# Patient Record
Sex: Female | Born: 1935 | Hispanic: No | Marital: Married | Smoking: Never smoker
Health system: Southern US, Community
[De-identification: ages and names within clinical notes are randomized; demographics above are authoritative.]

## PROBLEM LIST (undated history)

## (undated) DIAGNOSIS — F32A Depression, unspecified: Secondary | ICD-10-CM

## (undated) DIAGNOSIS — R269 Unspecified abnormalities of gait and mobility: Secondary | ICD-10-CM

## (undated) DIAGNOSIS — F411 Generalized anxiety disorder: Secondary | ICD-10-CM

## (undated) DIAGNOSIS — K219 Gastro-esophageal reflux disease without esophagitis: Secondary | ICD-10-CM

## (undated) DIAGNOSIS — F0390 Unspecified dementia without behavioral disturbance: Secondary | ICD-10-CM

## (undated) DIAGNOSIS — G3184 Mild cognitive impairment, so stated: Secondary | ICD-10-CM

## (undated) DIAGNOSIS — E119 Type 2 diabetes mellitus without complications: Secondary | ICD-10-CM

## (undated) DIAGNOSIS — R441 Visual hallucinations: Secondary | ICD-10-CM

## (undated) DIAGNOSIS — I480 Paroxysmal atrial fibrillation: Secondary | ICD-10-CM

## (undated) DIAGNOSIS — F419 Anxiety disorder, unspecified: Secondary | ICD-10-CM

## (undated) DIAGNOSIS — F329 Major depressive disorder, single episode, unspecified: Secondary | ICD-10-CM

## (undated) HISTORY — PX: ESOPHAGEAL DILATION: SHX303

## (undated) HISTORY — PX: TONSILLECTOMY: SUR1361

## (undated) HISTORY — DX: Anxiety disorder, unspecified: F41.9

## (undated) HISTORY — DX: Paroxysmal atrial fibrillation: I48.0

## (undated) HISTORY — PX: OOPHORECTOMY: SHX86

## (undated) HISTORY — PX: ABDOMINAL SACROCOLPOPEXY: SHX1114

## (undated) HISTORY — DX: Type 2 diabetes mellitus without complications: E11.9

## (undated) HISTORY — DX: Visual hallucinations: R44.1

## (undated) HISTORY — DX: Unspecified abnormalities of gait and mobility: R26.9

## (undated) HISTORY — PX: SUPRAPUBIC CATHETER INSERTION: SUR719

## (undated) HISTORY — DX: Generalized anxiety disorder: F41.1

## (undated) HISTORY — DX: Mild cognitive impairment of uncertain or unknown etiology: G31.84

## (undated) HISTORY — PX: ABDOMINAL HYSTERECTOMY: SUR658

## (undated) HISTORY — DX: Gastro-esophageal reflux disease without esophagitis: K21.9

## (undated) HISTORY — PX: COLPORRHAPHY: SHX921

## (undated) HISTORY — PX: OTHER SURGICAL HISTORY: SHX169

## (undated) HISTORY — DX: Unspecified dementia, unspecified severity, without behavioral disturbance, psychotic disturbance, mood disturbance, and anxiety: F03.90

## (undated) HISTORY — DX: Depression, unspecified: F32.A

## (undated) HISTORY — PX: HIP SURGERY: SHX245

---
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
## 2005-02-14 ENCOUNTER — Other Ambulatory Visit: Admission: RE | Admit: 2005-02-14 | Discharge: 2005-02-14 | Payer: Self-pay | Admitting: Obstetrics and Gynecology

## 2007-01-22 ENCOUNTER — Ambulatory Visit: Payer: Self-pay | Admitting: Internal Medicine

## 2007-01-28 ENCOUNTER — Ambulatory Visit: Payer: Self-pay | Admitting: Internal Medicine

## 2007-01-28 ENCOUNTER — Ambulatory Visit: Payer: Self-pay

## 2007-01-28 ENCOUNTER — Encounter: Payer: Self-pay | Admitting: Internal Medicine

## 2007-02-27 ENCOUNTER — Ambulatory Visit: Payer: Self-pay | Admitting: Internal Medicine

## 2007-02-27 LAB — CONVERTED CEMR LAB
BUN: 15 mg/dL (ref 6–23)
CO2: 28 meq/L (ref 19–32)
Calcium: 10.4 mg/dL (ref 8.4–10.5)
Creatinine, Ser: 1.3 mg/dL — ABNORMAL HIGH (ref 0.4–1.2)
GFR calc Af Amer: 51 mL/min
Glucose, Bld: 143 mg/dL — ABNORMAL HIGH (ref 70–99)
Potassium: 4.4 meq/L (ref 3.5–5.1)

## 2010-08-14 NOTE — Assessment & Plan Note (Signed)
[facility]                            CARDIOLOGY OFFICE NOTE   EVALIN, SHAWHAN                   MRN:          [id number]  DATE:01/22/2007                            DOB:          26-Nov-1930    REFERRING PHYSICIAN:  Marianne Sofia   REASON FOR CONSULTATION:  Irregular heartbeat.   HISTORY OF PRESENT ILLNESS:  Ms. Wanda Gonzalez is a very pleasant 77 year old  woman with the history of hypertension and hyperlipidemia.  She denies  any known history of coronary artery disease.  She tells me, about two  years ago, she developed left-arm pain and was evaluated with an EKG and  was directly admitted to the hospital in [place].  She said she  underwent a whole battery of tests, including a stress test.  She said  everything turned out to be normal, though she did not have specific  results.  Back in 2000, she was also evaluated by Dr. Tenny Craw.  She was  attempting to give blood multiple times and she always got refused  because she was too tachycardic.  She saw Dr. Tenny Craw, who took her off the  Cardura and the tachycardia resolved.   Recently saw her primary care Tally Mattox and noticed that she had an  irregular heartbeat.  EKG was obtained and she was referred here.  She  denies any chest pain or shortness of breath.  She is fairly active,  though she does not exercise regularly.  She does have occasional  palpitations, when she says she feels nervous, but no syncope or  presyncope.   REVIEW OF SYSTEMS:  Notable for arthritis and allergies and occasional  indigestion.  Remainder of the review of systems is negative, except for  HPI, and problem list.   PAST MEDICAL HISTORY:  1. History of arm pain in 2006.      a.     Reported negative heart workup at that time in [place].       Details unavailable.  2. Hypertension.  3. Hyperlipidemia.   CURRENT MEDICATIONS:  1. Lisinopril 10 mg a day.  2. Metoprolol 50 mg a day.  3. Hydrochlorothiazide 12.5 a  day.  4. Tricor 145 mg a day.  5. Biotin 1000 micrograms a day.  6. Multivitamin.  7. Aspirin 325.   ALLERGIES:  To AMOXICILLIN.   SOCIAL HISTORY:  She is widowed.  She has two children.  She lives in  [place].  She denies any tobacco or alcohol use.  She has very limited  caffeine intake.  She is retired.   FAMILY HISTORY:  Mother died at 96, due to stroke.  Father died at 38  due to a stroke.  There is no family history of premature coronary  artery disease.   PHYSICAL EXAM:  She is a pleasant woman, in no acute distress.  She  ambulates around the clinic without any respiratory difficulty.  Speech  is just mildly pressured.  Heart rate 72, blood pressure is 150/90, heart rate 70.  HEENT:  Normal.  NECK:  Supple, no JVD.  Carotids are 2+ bilaterally without any bruits.  There is no  lymphadenopathy or thyromegaly.  CARDIAC:  PMI is nondisplaced.  She is irregular with an S4.  There are  no murmurs.  LUNGS:  Clear.  ABDOMEN:  Soft, nontender, nondistended.  There is no  hepatosplenomegaly, no bruits, no masses, good bowel sounds.  EXTREMITIES:  Warm with no cyanosis, clubbing or edema.  No rash.  NEUROLOGIC:  Alert and oriented times three.  Cranial nerves II through  XII are intact.  Moves all four extremities without difficulty.  Affect  is pleasant.  As above, her speech is mildly pressured.   I have reviewed multiple EKGs and she has sinus rhythm with sinus  arrhythmia and occasional PACs.  There is no evidence of atrial  fibrillation.  No significant STT-wave abnormalities.   ASSESSMENT AND PLAN:  1. Palpitations and irregular heartbeat.  This seems to be mostly      sinus arrhythmia and PACs.  I do not see any events of atrial      fibrillation.  We will go ahead and put a 48-hour Holter monitor on      to further evaluate.  Also get an echocardiogram to make sure she      has a structurally normal heart.  She did have a thyroid panel      recently, which was  normal.  2. Hypertension.  This is suboptimally controlled.  Increase her      Toprol to 100.  3. Hyperlipidemia.  Cholesterol is markedly elevated with a total      cholesterol of 260, triglycerides of 258, LDL of 154.  I have asked      her to follow up with her primary care Amilio Zehnder to discuss this      further.  However, she said if there is a therapy we recommend, she      would like [place] to go ahead and start it.  Thus, I have started her      on Simvastatin 40 mg a day with a goal LDL of less than 100.  She      will follow up with her primary care Tycen Dockter.   DISPOSITION:  We will see her back in six months.  We will get back to  her with the results of her testing.  Should she have atrial  fibrillation, then we would need to consider Coumadin.     Bevelyn Buckles. Bensimhon, MD  Electronically Signed    DRB/MedQ  DD: 01/22/2007  DT: 01/23/2007  Job #: [id number]   cc:   [facility], [place] Marianne Sofia

## 2011-09-03 DIAGNOSIS — E559 Vitamin D deficiency, unspecified: Secondary | ICD-10-CM | POA: Diagnosis not present

## 2011-09-03 DIAGNOSIS — I10 Essential (primary) hypertension: Secondary | ICD-10-CM | POA: Diagnosis not present

## 2011-09-03 DIAGNOSIS — E782 Mixed hyperlipidemia: Secondary | ICD-10-CM | POA: Diagnosis not present

## 2011-09-03 DIAGNOSIS — R7309 Other abnormal glucose: Secondary | ICD-10-CM | POA: Diagnosis not present

## 2011-09-03 DIAGNOSIS — Z79899 Other long term (current) drug therapy: Secondary | ICD-10-CM | POA: Diagnosis not present

## 2011-10-08 DIAGNOSIS — I4891 Unspecified atrial fibrillation: Secondary | ICD-10-CM | POA: Diagnosis not present

## 2011-12-03 DIAGNOSIS — N812 Incomplete uterovaginal prolapse: Secondary | ICD-10-CM | POA: Diagnosis not present

## 2011-12-03 DIAGNOSIS — R948 Abnormal results of function studies of other organs and systems: Secondary | ICD-10-CM | POA: Diagnosis not present

## 2011-12-10 DIAGNOSIS — N393 Stress incontinence (female) (male): Secondary | ICD-10-CM | POA: Diagnosis not present

## 2011-12-10 DIAGNOSIS — Z124 Encounter for screening for malignant neoplasm of cervix: Secondary | ICD-10-CM | POA: Diagnosis not present

## 2011-12-10 DIAGNOSIS — N813 Complete uterovaginal prolapse: Secondary | ICD-10-CM | POA: Diagnosis not present

## 2011-12-10 DIAGNOSIS — R339 Retention of urine, unspecified: Secondary | ICD-10-CM | POA: Diagnosis not present

## 2011-12-10 DIAGNOSIS — Z01419 Encounter for gynecological examination (general) (routine) without abnormal findings: Secondary | ICD-10-CM | POA: Diagnosis not present

## 2011-12-11 DIAGNOSIS — R339 Retention of urine, unspecified: Secondary | ICD-10-CM | POA: Diagnosis not present

## 2011-12-11 DIAGNOSIS — N393 Stress incontinence (female) (male): Secondary | ICD-10-CM | POA: Diagnosis not present

## 2011-12-11 DIAGNOSIS — N813 Complete uterovaginal prolapse: Secondary | ICD-10-CM | POA: Diagnosis not present

## 2011-12-11 DIAGNOSIS — Z01419 Encounter for gynecological examination (general) (routine) without abnormal findings: Secondary | ICD-10-CM | POA: Diagnosis not present

## 2011-12-12 DIAGNOSIS — E782 Mixed hyperlipidemia: Secondary | ICD-10-CM | POA: Diagnosis not present

## 2011-12-12 DIAGNOSIS — N814 Uterovaginal prolapse, unspecified: Secondary | ICD-10-CM | POA: Diagnosis not present

## 2011-12-12 DIAGNOSIS — I10 Essential (primary) hypertension: Secondary | ICD-10-CM | POA: Diagnosis not present

## 2011-12-12 DIAGNOSIS — Z01818 Encounter for other preprocedural examination: Secondary | ICD-10-CM | POA: Diagnosis not present

## 2011-12-17 DIAGNOSIS — N814 Uterovaginal prolapse, unspecified: Secondary | ICD-10-CM | POA: Diagnosis not present

## 2011-12-17 DIAGNOSIS — Z01818 Encounter for other preprocedural examination: Secondary | ICD-10-CM | POA: Diagnosis not present

## 2011-12-19 DIAGNOSIS — N813 Complete uterovaginal prolapse: Secondary | ICD-10-CM | POA: Diagnosis not present

## 2011-12-19 DIAGNOSIS — E785 Hyperlipidemia, unspecified: Secondary | ICD-10-CM | POA: Diagnosis not present

## 2011-12-19 DIAGNOSIS — D259 Leiomyoma of uterus, unspecified: Secondary | ICD-10-CM | POA: Diagnosis not present

## 2011-12-19 DIAGNOSIS — I10 Essential (primary) hypertension: Secondary | ICD-10-CM | POA: Diagnosis not present

## 2011-12-19 DIAGNOSIS — N816 Rectocele: Secondary | ICD-10-CM | POA: Diagnosis not present

## 2011-12-19 DIAGNOSIS — N72 Inflammatory disease of cervix uteri: Secondary | ICD-10-CM | POA: Diagnosis not present

## 2011-12-19 DIAGNOSIS — N8110 Cystocele, unspecified: Secondary | ICD-10-CM | POA: Diagnosis not present

## 2011-12-19 DIAGNOSIS — J309 Allergic rhinitis, unspecified: Secondary | ICD-10-CM | POA: Diagnosis not present

## 2011-12-19 DIAGNOSIS — N393 Stress incontinence (female) (male): Secondary | ICD-10-CM | POA: Diagnosis not present

## 2011-12-19 DIAGNOSIS — N812 Incomplete uterovaginal prolapse: Secondary | ICD-10-CM | POA: Diagnosis not present

## 2011-12-19 DIAGNOSIS — N840 Polyp of corpus uteri: Secondary | ICD-10-CM | POA: Diagnosis not present

## 2011-12-20 DIAGNOSIS — I10 Essential (primary) hypertension: Secondary | ICD-10-CM | POA: Diagnosis not present

## 2011-12-20 DIAGNOSIS — N72 Inflammatory disease of cervix uteri: Secondary | ICD-10-CM | POA: Diagnosis not present

## 2011-12-20 DIAGNOSIS — J309 Allergic rhinitis, unspecified: Secondary | ICD-10-CM | POA: Diagnosis not present

## 2011-12-20 DIAGNOSIS — N393 Stress incontinence (female) (male): Secondary | ICD-10-CM | POA: Diagnosis not present

## 2011-12-20 DIAGNOSIS — E785 Hyperlipidemia, unspecified: Secondary | ICD-10-CM | POA: Diagnosis not present

## 2011-12-20 DIAGNOSIS — N813 Complete uterovaginal prolapse: Secondary | ICD-10-CM | POA: Diagnosis not present

## 2012-01-24 DIAGNOSIS — Z23 Encounter for immunization: Secondary | ICD-10-CM | POA: Diagnosis not present

## 2012-04-13 DIAGNOSIS — N812 Incomplete uterovaginal prolapse: Secondary | ICD-10-CM | POA: Diagnosis not present

## 2012-04-14 DIAGNOSIS — I10 Essential (primary) hypertension: Secondary | ICD-10-CM | POA: Diagnosis not present

## 2012-04-14 DIAGNOSIS — E782 Mixed hyperlipidemia: Secondary | ICD-10-CM | POA: Diagnosis not present

## 2012-06-09 DIAGNOSIS — E782 Mixed hyperlipidemia: Secondary | ICD-10-CM | POA: Diagnosis not present

## 2012-06-09 DIAGNOSIS — Q794 Prune belly syndrome: Secondary | ICD-10-CM | POA: Diagnosis not present

## 2012-06-09 DIAGNOSIS — L02519 Cutaneous abscess of unspecified hand: Secondary | ICD-10-CM | POA: Diagnosis not present

## 2012-07-01 DIAGNOSIS — N815 Vaginal enterocele: Secondary | ICD-10-CM | POA: Diagnosis not present

## 2012-07-01 DIAGNOSIS — N8112 Cystocele, lateral: Secondary | ICD-10-CM | POA: Diagnosis not present

## 2012-08-02 DIAGNOSIS — I119 Hypertensive heart disease without heart failure: Secondary | ICD-10-CM | POA: Diagnosis not present

## 2012-08-03 DIAGNOSIS — I10 Essential (primary) hypertension: Secondary | ICD-10-CM | POA: Diagnosis not present

## 2012-08-03 DIAGNOSIS — Z01818 Encounter for other preprocedural examination: Secondary | ICD-10-CM | POA: Diagnosis not present

## 2012-08-03 DIAGNOSIS — N8112 Cystocele, lateral: Secondary | ICD-10-CM | POA: Diagnosis not present

## 2012-08-04 DIAGNOSIS — R7309 Other abnormal glucose: Secondary | ICD-10-CM | POA: Diagnosis not present

## 2012-08-12 DIAGNOSIS — K469 Unspecified abdominal hernia without obstruction or gangrene: Secondary | ICD-10-CM | POA: Diagnosis not present

## 2012-08-17 DIAGNOSIS — N993 Prolapse of vaginal vault after hysterectomy: Secondary | ICD-10-CM | POA: Diagnosis not present

## 2012-08-17 DIAGNOSIS — T8389XA Other specified complication of genitourinary prosthetic devices, implants and grafts, initial encounter: Secondary | ICD-10-CM | POA: Diagnosis not present

## 2012-08-17 DIAGNOSIS — R82998 Other abnormal findings in urine: Secondary | ICD-10-CM | POA: Diagnosis not present

## 2012-08-17 DIAGNOSIS — R87610 Atypical squamous cells of undetermined significance on cytologic smear of cervix (ASC-US): Secondary | ICD-10-CM | POA: Diagnosis not present

## 2012-08-17 DIAGNOSIS — R87810 Cervical high risk human papillomavirus (HPV) DNA test positive: Secondary | ICD-10-CM | POA: Diagnosis not present

## 2012-08-17 DIAGNOSIS — N814 Uterovaginal prolapse, unspecified: Secondary | ICD-10-CM | POA: Diagnosis not present

## 2012-08-17 DIAGNOSIS — R32 Unspecified urinary incontinence: Secondary | ICD-10-CM | POA: Diagnosis not present

## 2012-08-26 DIAGNOSIS — N993 Prolapse of vaginal vault after hysterectomy: Secondary | ICD-10-CM | POA: Diagnosis not present

## 2012-08-26 DIAGNOSIS — Z7982 Long term (current) use of aspirin: Secondary | ICD-10-CM | POA: Diagnosis not present

## 2012-08-26 DIAGNOSIS — I440 Atrioventricular block, first degree: Secondary | ICD-10-CM | POA: Diagnosis not present

## 2012-08-26 DIAGNOSIS — F411 Generalized anxiety disorder: Secondary | ICD-10-CM | POA: Diagnosis present

## 2012-08-26 DIAGNOSIS — I10 Essential (primary) hypertension: Secondary | ICD-10-CM | POA: Diagnosis not present

## 2012-08-26 DIAGNOSIS — M1990 Unspecified osteoarthritis, unspecified site: Secondary | ICD-10-CM | POA: Diagnosis present

## 2012-08-26 DIAGNOSIS — N393 Stress incontinence (female) (male): Secondary | ICD-10-CM | POA: Diagnosis not present

## 2012-08-26 DIAGNOSIS — N814 Uterovaginal prolapse, unspecified: Secondary | ICD-10-CM | POA: Diagnosis not present

## 2012-08-26 DIAGNOSIS — R918 Other nonspecific abnormal finding of lung field: Secondary | ICD-10-CM | POA: Diagnosis not present

## 2012-08-26 DIAGNOSIS — Z79899 Other long term (current) drug therapy: Secondary | ICD-10-CM | POA: Diagnosis not present

## 2012-08-26 DIAGNOSIS — Z01818 Encounter for other preprocedural examination: Secondary | ICD-10-CM | POA: Diagnosis not present

## 2012-08-26 DIAGNOSIS — R32 Unspecified urinary incontinence: Secondary | ICD-10-CM | POA: Diagnosis not present

## 2012-08-26 DIAGNOSIS — N8110 Cystocele, unspecified: Secondary | ICD-10-CM | POA: Diagnosis not present

## 2012-09-07 DIAGNOSIS — Z4802 Encounter for removal of sutures: Secondary | ICD-10-CM | POA: Diagnosis not present

## 2012-09-07 DIAGNOSIS — Z48816 Encounter for surgical aftercare following surgery on the genitourinary system: Secondary | ICD-10-CM | POA: Diagnosis not present

## 2012-09-09 DIAGNOSIS — R0902 Hypoxemia: Secondary | ICD-10-CM | POA: Diagnosis not present

## 2012-09-09 DIAGNOSIS — J984 Other disorders of lung: Secondary | ICD-10-CM | POA: Diagnosis not present

## 2012-09-15 DIAGNOSIS — J984 Other disorders of lung: Secondary | ICD-10-CM | POA: Diagnosis not present

## 2012-09-16 DIAGNOSIS — Z48816 Encounter for surgical aftercare following surgery on the genitourinary system: Secondary | ICD-10-CM | POA: Diagnosis not present

## 2012-09-17 DIAGNOSIS — R05 Cough: Secondary | ICD-10-CM | POA: Diagnosis not present

## 2012-09-17 DIAGNOSIS — J984 Other disorders of lung: Secondary | ICD-10-CM | POA: Diagnosis not present

## 2012-09-17 DIAGNOSIS — R0902 Hypoxemia: Secondary | ICD-10-CM | POA: Diagnosis not present

## 2012-10-14 DIAGNOSIS — Z9079 Acquired absence of other genital organ(s): Secondary | ICD-10-CM | POA: Diagnosis not present

## 2012-10-14 DIAGNOSIS — Z48816 Encounter for surgical aftercare following surgery on the genitourinary system: Secondary | ICD-10-CM | POA: Diagnosis not present

## 2012-10-19 DIAGNOSIS — R0902 Hypoxemia: Secondary | ICD-10-CM | POA: Diagnosis not present

## 2012-10-19 DIAGNOSIS — J984 Other disorders of lung: Secondary | ICD-10-CM | POA: Diagnosis not present

## 2012-10-30 DIAGNOSIS — E119 Type 2 diabetes mellitus without complications: Secondary | ICD-10-CM | POA: Diagnosis not present

## 2012-10-30 DIAGNOSIS — R7989 Other specified abnormal findings of blood chemistry: Secondary | ICD-10-CM | POA: Diagnosis not present

## 2012-10-30 DIAGNOSIS — R7309 Other abnormal glucose: Secondary | ICD-10-CM | POA: Diagnosis not present

## 2012-11-05 DIAGNOSIS — Z713 Dietary counseling and surveillance: Secondary | ICD-10-CM | POA: Diagnosis not present

## 2012-11-05 DIAGNOSIS — E119 Type 2 diabetes mellitus without complications: Secondary | ICD-10-CM | POA: Diagnosis not present

## 2012-11-16 DIAGNOSIS — N760 Acute vaginitis: Secondary | ICD-10-CM | POA: Diagnosis not present

## 2012-11-19 DIAGNOSIS — J984 Other disorders of lung: Secondary | ICD-10-CM | POA: Diagnosis not present

## 2012-11-19 DIAGNOSIS — R7309 Other abnormal glucose: Secondary | ICD-10-CM | POA: Diagnosis not present

## 2012-11-19 DIAGNOSIS — I10 Essential (primary) hypertension: Secondary | ICD-10-CM | POA: Diagnosis not present

## 2012-11-19 DIAGNOSIS — K219 Gastro-esophageal reflux disease without esophagitis: Secondary | ICD-10-CM | POA: Diagnosis not present

## 2012-11-19 DIAGNOSIS — E782 Mixed hyperlipidemia: Secondary | ICD-10-CM | POA: Diagnosis not present

## 2012-11-19 DIAGNOSIS — R609 Edema, unspecified: Secondary | ICD-10-CM | POA: Diagnosis not present

## 2012-12-28 DIAGNOSIS — E119 Type 2 diabetes mellitus without complications: Secondary | ICD-10-CM | POA: Diagnosis not present

## 2013-01-07 DIAGNOSIS — Z23 Encounter for immunization: Secondary | ICD-10-CM | POA: Diagnosis not present

## 2013-06-01 DIAGNOSIS — E559 Vitamin D deficiency, unspecified: Secondary | ICD-10-CM | POA: Diagnosis not present

## 2013-06-01 DIAGNOSIS — E782 Mixed hyperlipidemia: Secondary | ICD-10-CM | POA: Diagnosis not present

## 2013-06-01 DIAGNOSIS — I10 Essential (primary) hypertension: Secondary | ICD-10-CM | POA: Diagnosis not present

## 2013-06-01 DIAGNOSIS — R350 Frequency of micturition: Secondary | ICD-10-CM | POA: Diagnosis not present

## 2013-06-01 DIAGNOSIS — N390 Urinary tract infection, site not specified: Secondary | ICD-10-CM | POA: Diagnosis not present

## 2013-06-01 DIAGNOSIS — E119 Type 2 diabetes mellitus without complications: Secondary | ICD-10-CM | POA: Diagnosis not present

## 2013-06-01 DIAGNOSIS — R7309 Other abnormal glucose: Secondary | ICD-10-CM | POA: Diagnosis not present

## 2013-06-01 DIAGNOSIS — Z79899 Other long term (current) drug therapy: Secondary | ICD-10-CM | POA: Diagnosis not present

## 2013-06-10 DIAGNOSIS — IMO0001 Reserved for inherently not codable concepts without codable children: Secondary | ICD-10-CM | POA: Diagnosis not present

## 2013-06-10 DIAGNOSIS — N390 Urinary tract infection, site not specified: Secondary | ICD-10-CM | POA: Diagnosis not present

## 2013-06-15 DIAGNOSIS — N3000 Acute cystitis without hematuria: Secondary | ICD-10-CM | POA: Diagnosis not present

## 2013-06-16 DIAGNOSIS — N3000 Acute cystitis without hematuria: Secondary | ICD-10-CM | POA: Diagnosis not present

## 2013-06-17 DIAGNOSIS — N3000 Acute cystitis without hematuria: Secondary | ICD-10-CM | POA: Diagnosis not present

## 2013-08-24 DIAGNOSIS — J984 Other disorders of lung: Secondary | ICD-10-CM | POA: Diagnosis not present

## 2013-08-24 DIAGNOSIS — R911 Solitary pulmonary nodule: Secondary | ICD-10-CM | POA: Diagnosis not present

## 2013-09-01 DIAGNOSIS — I10 Essential (primary) hypertension: Secondary | ICD-10-CM | POA: Diagnosis not present

## 2013-09-01 DIAGNOSIS — F3289 Other specified depressive episodes: Secondary | ICD-10-CM | POA: Diagnosis not present

## 2013-09-01 DIAGNOSIS — K219 Gastro-esophageal reflux disease without esophagitis: Secondary | ICD-10-CM | POA: Diagnosis not present

## 2013-09-01 DIAGNOSIS — F329 Major depressive disorder, single episode, unspecified: Secondary | ICD-10-CM | POA: Diagnosis not present

## 2013-09-01 DIAGNOSIS — E782 Mixed hyperlipidemia: Secondary | ICD-10-CM | POA: Diagnosis not present

## 2013-09-01 DIAGNOSIS — K5909 Other constipation: Secondary | ICD-10-CM | POA: Diagnosis not present

## 2013-10-14 DIAGNOSIS — F411 Generalized anxiety disorder: Secondary | ICD-10-CM | POA: Diagnosis not present

## 2013-10-14 DIAGNOSIS — IMO0002 Reserved for concepts with insufficient information to code with codable children: Secondary | ICD-10-CM | POA: Diagnosis not present

## 2013-10-14 DIAGNOSIS — I10 Essential (primary) hypertension: Secondary | ICD-10-CM | POA: Diagnosis not present

## 2013-10-14 DIAGNOSIS — E119 Type 2 diabetes mellitus without complications: Secondary | ICD-10-CM | POA: Diagnosis not present

## 2013-10-14 DIAGNOSIS — E785 Hyperlipidemia, unspecified: Secondary | ICD-10-CM | POA: Diagnosis not present

## 2013-11-15 DIAGNOSIS — H5200 Hypermetropia, unspecified eye: Secondary | ICD-10-CM | POA: Diagnosis not present

## 2013-11-15 DIAGNOSIS — E119 Type 2 diabetes mellitus without complications: Secondary | ICD-10-CM | POA: Diagnosis not present

## 2013-11-16 DIAGNOSIS — H26499 Other secondary cataract, unspecified eye: Secondary | ICD-10-CM | POA: Diagnosis not present

## 2013-11-23 DIAGNOSIS — H26499 Other secondary cataract, unspecified eye: Secondary | ICD-10-CM | POA: Diagnosis not present

## 2013-11-24 DIAGNOSIS — K140 Glossitis: Secondary | ICD-10-CM | POA: Diagnosis not present

## 2013-11-24 DIAGNOSIS — IMO0002 Reserved for concepts with insufficient information to code with codable children: Secondary | ICD-10-CM | POA: Diagnosis not present

## 2013-11-24 DIAGNOSIS — I10 Essential (primary) hypertension: Secondary | ICD-10-CM | POA: Diagnosis not present

## 2013-11-24 DIAGNOSIS — K6289 Other specified diseases of anus and rectum: Secondary | ICD-10-CM | POA: Diagnosis not present

## 2013-11-24 DIAGNOSIS — E119 Type 2 diabetes mellitus without complications: Secondary | ICD-10-CM | POA: Diagnosis not present

## 2013-11-24 DIAGNOSIS — K5900 Constipation, unspecified: Secondary | ICD-10-CM | POA: Diagnosis not present

## 2013-11-24 DIAGNOSIS — R634 Abnormal weight loss: Secondary | ICD-10-CM | POA: Diagnosis not present

## 2013-12-02 DIAGNOSIS — R634 Abnormal weight loss: Secondary | ICD-10-CM | POA: Diagnosis not present

## 2013-12-02 DIAGNOSIS — K5900 Constipation, unspecified: Secondary | ICD-10-CM | POA: Diagnosis not present

## 2013-12-02 DIAGNOSIS — K921 Melena: Secondary | ICD-10-CM | POA: Diagnosis not present

## 2013-12-14 DIAGNOSIS — R109 Unspecified abdominal pain: Secondary | ICD-10-CM | POA: Diagnosis not present

## 2013-12-14 DIAGNOSIS — R634 Abnormal weight loss: Secondary | ICD-10-CM | POA: Diagnosis not present

## 2013-12-14 DIAGNOSIS — K921 Melena: Secondary | ICD-10-CM | POA: Diagnosis not present

## 2013-12-14 DIAGNOSIS — R1084 Generalized abdominal pain: Secondary | ICD-10-CM | POA: Diagnosis not present

## 2013-12-21 DIAGNOSIS — R634 Abnormal weight loss: Secondary | ICD-10-CM | POA: Diagnosis not present

## 2013-12-21 DIAGNOSIS — K5900 Constipation, unspecified: Secondary | ICD-10-CM | POA: Diagnosis not present

## 2013-12-21 DIAGNOSIS — K921 Melena: Secondary | ICD-10-CM | POA: Diagnosis not present

## 2013-12-27 DIAGNOSIS — K921 Melena: Secondary | ICD-10-CM | POA: Diagnosis not present

## 2013-12-28 DIAGNOSIS — H26499 Other secondary cataract, unspecified eye: Secondary | ICD-10-CM | POA: Diagnosis not present

## 2014-01-03 DIAGNOSIS — H26491 Other secondary cataract, right eye: Secondary | ICD-10-CM | POA: Diagnosis not present

## 2014-01-14 DIAGNOSIS — E780 Pure hypercholesterolemia: Secondary | ICD-10-CM | POA: Diagnosis not present

## 2014-01-14 DIAGNOSIS — E119 Type 2 diabetes mellitus without complications: Secondary | ICD-10-CM | POA: Diagnosis not present

## 2014-01-14 DIAGNOSIS — H6123 Impacted cerumen, bilateral: Secondary | ICD-10-CM | POA: Diagnosis not present

## 2014-01-14 DIAGNOSIS — J984 Other disorders of lung: Secondary | ICD-10-CM | POA: Diagnosis not present

## 2014-01-14 DIAGNOSIS — I10 Essential (primary) hypertension: Secondary | ICD-10-CM | POA: Diagnosis not present

## 2014-01-14 DIAGNOSIS — Z23 Encounter for immunization: Secondary | ICD-10-CM | POA: Diagnosis not present

## 2014-01-14 DIAGNOSIS — R634 Abnormal weight loss: Secondary | ICD-10-CM | POA: Diagnosis not present

## 2014-01-14 DIAGNOSIS — R011 Cardiac murmur, unspecified: Secondary | ICD-10-CM | POA: Diagnosis not present

## 2014-01-20 DIAGNOSIS — Z1231 Encounter for screening mammogram for malignant neoplasm of breast: Secondary | ICD-10-CM | POA: Diagnosis not present

## 2014-01-20 DIAGNOSIS — J984 Other disorders of lung: Secondary | ICD-10-CM | POA: Diagnosis not present

## 2014-01-20 DIAGNOSIS — R918 Other nonspecific abnormal finding of lung field: Secondary | ICD-10-CM | POA: Diagnosis not present

## 2014-01-24 DIAGNOSIS — E119 Type 2 diabetes mellitus without complications: Secondary | ICD-10-CM | POA: Diagnosis not present

## 2014-01-24 DIAGNOSIS — E876 Hypokalemia: Secondary | ICD-10-CM | POA: Diagnosis not present

## 2014-01-24 DIAGNOSIS — E782 Mixed hyperlipidemia: Secondary | ICD-10-CM | POA: Diagnosis not present

## 2014-01-24 DIAGNOSIS — E786 Lipoprotein deficiency: Secondary | ICD-10-CM | POA: Diagnosis not present

## 2014-01-24 DIAGNOSIS — I10 Essential (primary) hypertension: Secondary | ICD-10-CM | POA: Diagnosis not present

## 2014-01-24 DIAGNOSIS — Z23 Encounter for immunization: Secondary | ICD-10-CM | POA: Diagnosis not present

## 2014-02-01 DIAGNOSIS — E0590 Thyrotoxicosis, unspecified without thyrotoxic crisis or storm: Secondary | ICD-10-CM | POA: Diagnosis not present

## 2014-02-01 DIAGNOSIS — E213 Hyperparathyroidism, unspecified: Secondary | ICD-10-CM | POA: Diagnosis not present

## 2014-02-01 DIAGNOSIS — E042 Nontoxic multinodular goiter: Secondary | ICD-10-CM | POA: Diagnosis not present

## 2014-03-16 DIAGNOSIS — E213 Hyperparathyroidism, unspecified: Secondary | ICD-10-CM | POA: Diagnosis not present

## 2014-03-17 DIAGNOSIS — H6123 Impacted cerumen, bilateral: Secondary | ICD-10-CM | POA: Diagnosis not present

## 2014-03-28 DIAGNOSIS — Z780 Asymptomatic menopausal state: Secondary | ICD-10-CM | POA: Diagnosis not present

## 2014-03-28 DIAGNOSIS — M8580 Other specified disorders of bone density and structure, unspecified site: Secondary | ICD-10-CM | POA: Diagnosis not present

## 2014-03-28 DIAGNOSIS — E0590 Thyrotoxicosis, unspecified without thyrotoxic crisis or storm: Secondary | ICD-10-CM | POA: Diagnosis not present

## 2014-04-11 DIAGNOSIS — E213 Hyperparathyroidism, unspecified: Secondary | ICD-10-CM | POA: Diagnosis not present

## 2014-04-15 ENCOUNTER — Other Ambulatory Visit (HOSPITAL_COMMUNITY): Payer: Self-pay | Admitting: Endocrinology

## 2014-04-15 DIAGNOSIS — E213 Hyperparathyroidism, unspecified: Secondary | ICD-10-CM

## 2014-04-22 ENCOUNTER — Encounter (HOSPITAL_COMMUNITY): Payer: Medicare Other

## 2014-04-26 DIAGNOSIS — E782 Mixed hyperlipidemia: Secondary | ICD-10-CM | POA: Diagnosis not present

## 2014-04-26 DIAGNOSIS — I10 Essential (primary) hypertension: Secondary | ICD-10-CM | POA: Diagnosis not present

## 2014-04-26 DIAGNOSIS — E876 Hypokalemia: Secondary | ICD-10-CM | POA: Diagnosis not present

## 2014-04-26 DIAGNOSIS — E119 Type 2 diabetes mellitus without complications: Secondary | ICD-10-CM | POA: Diagnosis not present

## 2014-05-03 ENCOUNTER — Encounter (HOSPITAL_COMMUNITY): Payer: Medicare Other

## 2014-05-03 ENCOUNTER — Encounter (HOSPITAL_COMMUNITY): Payer: Self-pay

## 2014-05-05 ENCOUNTER — Encounter (HOSPITAL_COMMUNITY)
Admission: RE | Admit: 2014-05-05 | Discharge: 2014-05-05 | Disposition: A | Discharge status: Home / Self Care | Payer: Medicare Other | Source: Ambulatory Visit | Attending: Endocrinology | Admitting: Endocrinology

## 2014-05-05 ENCOUNTER — Encounter (HOSPITAL_COMMUNITY): Admission: RE | Admit: 2014-05-05 | Payer: Medicare Other | Source: Ambulatory Visit

## 2014-05-05 ENCOUNTER — Encounter (HOSPITAL_COMMUNITY): Payer: Medicare Other

## 2014-05-05 DIAGNOSIS — E213 Hyperparathyroidism, unspecified: Secondary | ICD-10-CM | POA: Insufficient documentation

## 2014-05-05 MED ORDER — TECHNETIUM TC 99M SESTAMIBI GENERIC - CARDIOLITE
25.0000 | Freq: Once | INTRAVENOUS | Status: AC | PRN
  Administered 2014-05-05: 25 via INTRAVENOUS

## 2014-05-24 DIAGNOSIS — E210 Primary hyperparathyroidism: Secondary | ICD-10-CM | POA: Diagnosis not present

## 2014-06-07 DIAGNOSIS — D367 Benign neoplasm of other specified sites: Secondary | ICD-10-CM | POA: Diagnosis not present

## 2014-06-07 DIAGNOSIS — Z79899 Other long term (current) drug therapy: Secondary | ICD-10-CM | POA: Diagnosis not present

## 2014-06-07 DIAGNOSIS — E119 Type 2 diabetes mellitus without complications: Secondary | ICD-10-CM | POA: Diagnosis not present

## 2014-06-07 DIAGNOSIS — K219 Gastro-esophageal reflux disease without esophagitis: Secondary | ICD-10-CM | POA: Diagnosis not present

## 2014-06-07 DIAGNOSIS — I499 Cardiac arrhythmia, unspecified: Secondary | ICD-10-CM | POA: Diagnosis not present

## 2014-06-07 DIAGNOSIS — E210 Primary hyperparathyroidism: Secondary | ICD-10-CM | POA: Diagnosis not present

## 2014-06-07 DIAGNOSIS — I10 Essential (primary) hypertension: Secondary | ICD-10-CM | POA: Diagnosis not present

## 2014-06-16 DIAGNOSIS — E210 Primary hyperparathyroidism: Secondary | ICD-10-CM | POA: Diagnosis not present

## 2014-06-16 DIAGNOSIS — D367 Benign neoplasm of other specified sites: Secondary | ICD-10-CM | POA: Diagnosis not present

## 2014-06-16 DIAGNOSIS — K219 Gastro-esophageal reflux disease without esophagitis: Secondary | ICD-10-CM | POA: Diagnosis not present

## 2014-06-16 DIAGNOSIS — E119 Type 2 diabetes mellitus without complications: Secondary | ICD-10-CM | POA: Diagnosis not present

## 2014-06-16 DIAGNOSIS — I10 Essential (primary) hypertension: Secondary | ICD-10-CM | POA: Diagnosis not present

## 2014-06-16 DIAGNOSIS — Z79899 Other long term (current) drug therapy: Secondary | ICD-10-CM | POA: Diagnosis not present

## 2014-06-16 DIAGNOSIS — D351 Benign neoplasm of parathyroid gland: Secondary | ICD-10-CM | POA: Diagnosis not present

## 2014-07-08 DIAGNOSIS — T814XXA Infection following a procedure, initial encounter: Secondary | ICD-10-CM | POA: Diagnosis not present

## 2014-07-27 DIAGNOSIS — E782 Mixed hyperlipidemia: Secondary | ICD-10-CM | POA: Diagnosis not present

## 2014-07-27 DIAGNOSIS — E119 Type 2 diabetes mellitus without complications: Secondary | ICD-10-CM | POA: Diagnosis not present

## 2014-07-27 DIAGNOSIS — I10 Essential (primary) hypertension: Secondary | ICD-10-CM | POA: Diagnosis not present

## 2014-10-26 DIAGNOSIS — R7301 Impaired fasting glucose: Secondary | ICD-10-CM | POA: Diagnosis not present

## 2014-10-26 DIAGNOSIS — R5383 Other fatigue: Secondary | ICD-10-CM | POA: Diagnosis not present

## 2014-10-26 DIAGNOSIS — E119 Type 2 diabetes mellitus without complications: Secondary | ICD-10-CM | POA: Diagnosis not present

## 2014-10-26 DIAGNOSIS — I10 Essential (primary) hypertension: Secondary | ICD-10-CM | POA: Diagnosis not present

## 2014-10-26 DIAGNOSIS — E782 Mixed hyperlipidemia: Secondary | ICD-10-CM | POA: Diagnosis not present

## 2014-10-26 DIAGNOSIS — E109 Type 1 diabetes mellitus without complications: Secondary | ICD-10-CM | POA: Diagnosis not present

## 2015-01-02 DIAGNOSIS — N920 Excessive and frequent menstruation with regular cycle: Secondary | ICD-10-CM | POA: Diagnosis not present

## 2015-01-02 DIAGNOSIS — A630 Anogenital (venereal) warts: Secondary | ICD-10-CM | POA: Diagnosis not present

## 2015-01-03 DIAGNOSIS — N920 Excessive and frequent menstruation with regular cycle: Secondary | ICD-10-CM | POA: Diagnosis not present

## 2015-01-03 DIAGNOSIS — Z23 Encounter for immunization: Secondary | ICD-10-CM | POA: Diagnosis not present

## 2015-01-04 DIAGNOSIS — S31109A Unspecified open wound of abdominal wall, unspecified quadrant without penetration into peritoneal cavity, initial encounter: Secondary | ICD-10-CM | POA: Diagnosis not present

## 2015-01-04 DIAGNOSIS — E119 Type 2 diabetes mellitus without complications: Secondary | ICD-10-CM | POA: Diagnosis not present

## 2015-01-04 DIAGNOSIS — M1990 Unspecified osteoarthritis, unspecified site: Secondary | ICD-10-CM | POA: Diagnosis not present

## 2015-01-04 DIAGNOSIS — I10 Essential (primary) hypertension: Secondary | ICD-10-CM | POA: Diagnosis not present

## 2015-01-04 DIAGNOSIS — Z48816 Encounter for surgical aftercare following surgery on the genitourinary system: Secondary | ICD-10-CM | POA: Diagnosis not present

## 2015-01-04 DIAGNOSIS — F40240 Claustrophobia: Secondary | ICD-10-CM | POA: Diagnosis not present

## 2015-01-04 DIAGNOSIS — I2510 Atherosclerotic heart disease of native coronary artery without angina pectoris: Secondary | ICD-10-CM | POA: Diagnosis not present

## 2015-01-11 DIAGNOSIS — S31109D Unspecified open wound of abdominal wall, unspecified quadrant without penetration into peritoneal cavity, subsequent encounter: Secondary | ICD-10-CM | POA: Diagnosis not present

## 2015-01-11 DIAGNOSIS — Z48816 Encounter for surgical aftercare following surgery on the genitourinary system: Secondary | ICD-10-CM | POA: Diagnosis not present

## 2015-01-18 DIAGNOSIS — S31109D Unspecified open wound of abdominal wall, unspecified quadrant without penetration into peritoneal cavity, subsequent encounter: Secondary | ICD-10-CM | POA: Diagnosis not present

## 2015-01-18 DIAGNOSIS — Z48816 Encounter for surgical aftercare following surgery on the genitourinary system: Secondary | ICD-10-CM | POA: Diagnosis not present

## 2015-01-25 DIAGNOSIS — Z48816 Encounter for surgical aftercare following surgery on the genitourinary system: Secondary | ICD-10-CM | POA: Diagnosis not present

## 2015-01-25 DIAGNOSIS — S31109D Unspecified open wound of abdominal wall, unspecified quadrant without penetration into peritoneal cavity, subsequent encounter: Secondary | ICD-10-CM | POA: Diagnosis not present

## 2015-01-31 DIAGNOSIS — A630 Anogenital (venereal) warts: Secondary | ICD-10-CM | POA: Diagnosis not present

## 2015-02-01 DIAGNOSIS — S31109D Unspecified open wound of abdominal wall, unspecified quadrant without penetration into peritoneal cavity, subsequent encounter: Secondary | ICD-10-CM | POA: Diagnosis not present

## 2015-02-01 DIAGNOSIS — Z48816 Encounter for surgical aftercare following surgery on the genitourinary system: Secondary | ICD-10-CM | POA: Diagnosis not present

## 2015-02-08 DIAGNOSIS — Z48816 Encounter for surgical aftercare following surgery on the genitourinary system: Secondary | ICD-10-CM | POA: Diagnosis not present

## 2015-02-08 DIAGNOSIS — S31109D Unspecified open wound of abdominal wall, unspecified quadrant without penetration into peritoneal cavity, subsequent encounter: Secondary | ICD-10-CM | POA: Diagnosis not present

## 2015-02-15 DIAGNOSIS — Z48816 Encounter for surgical aftercare following surgery on the genitourinary system: Secondary | ICD-10-CM | POA: Diagnosis not present

## 2015-02-15 DIAGNOSIS — Z872 Personal history of diseases of the skin and subcutaneous tissue: Secondary | ICD-10-CM | POA: Diagnosis not present

## 2015-02-15 DIAGNOSIS — Z09 Encounter for follow-up examination after completed treatment for conditions other than malignant neoplasm: Secondary | ICD-10-CM | POA: Diagnosis not present

## 2015-03-16 DIAGNOSIS — Z0000 Encounter for general adult medical examination without abnormal findings: Secondary | ICD-10-CM | POA: Diagnosis not present

## 2015-03-16 DIAGNOSIS — Z6826 Body mass index (BMI) 26.0-26.9, adult: Secondary | ICD-10-CM | POA: Diagnosis not present

## 2015-03-16 DIAGNOSIS — I10 Essential (primary) hypertension: Secondary | ICD-10-CM | POA: Diagnosis not present

## 2015-03-16 DIAGNOSIS — N3000 Acute cystitis without hematuria: Secondary | ICD-10-CM | POA: Diagnosis not present

## 2015-03-16 DIAGNOSIS — Z0001 Encounter for general adult medical examination with abnormal findings: Secondary | ICD-10-CM | POA: Diagnosis not present

## 2015-03-16 DIAGNOSIS — Z23 Encounter for immunization: Secondary | ICD-10-CM | POA: Diagnosis not present

## 2015-03-16 DIAGNOSIS — R7301 Impaired fasting glucose: Secondary | ICD-10-CM | POA: Diagnosis not present

## 2015-04-05 DIAGNOSIS — Z1231 Encounter for screening mammogram for malignant neoplasm of breast: Secondary | ICD-10-CM | POA: Diagnosis not present

## 2015-05-09 DIAGNOSIS — E119 Type 2 diabetes mellitus without complications: Secondary | ICD-10-CM | POA: Diagnosis not present

## 2015-09-20 DIAGNOSIS — E119 Type 2 diabetes mellitus without complications: Secondary | ICD-10-CM | POA: Diagnosis not present

## 2015-09-20 DIAGNOSIS — E782 Mixed hyperlipidemia: Secondary | ICD-10-CM | POA: Diagnosis not present

## 2015-09-20 DIAGNOSIS — I10 Essential (primary) hypertension: Secondary | ICD-10-CM | POA: Diagnosis not present

## 2015-12-18 IMAGING — NM NM PARATHYROID W/ SPECT
6 series · 21 of 21 positions shown · non-contrast
Comparison: CT 01/20/2014

CLINICAL DATA: Go hyperparathyroidism.  PTH equal 265

EXAM:
NM PARATHYROID SCINTIGRAPHY AND SPECT IMAGING
TECHNIQUE: Following intravenous administration of radiopharmaceutical, early
and 2-hour delayed planar images were obtained in the anterior
projection. Delayed triplanar SPECT images were also obtained at 2
hours.
RADIOPHARMACEUTICALS:  25  [id number] Sestamibi IV

[Series 1: spect - (id)_(id)_cor · 8.3mm · 8.28mm/px · 6 of 64 frames shown]
[frame 6/64]
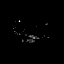
[frame 16/64]
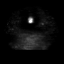
[frame 27/64]
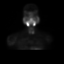
[frame 38/64]
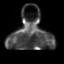
[frame 48/64]
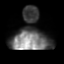
[frame 59/64]
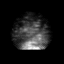

[Series 1: 15 min ant · 4.14mm/px · 1 of 1 slices shown]
[im 1/1]
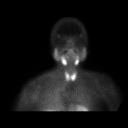

[Series 1: spect - (id)_(id)_tra · 8.3mm · 8.28mm/px · 6 of 64 frames shown]
[frame 6/64]
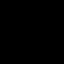
[frame 16/64]
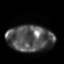
[frame 27/64]
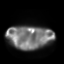
[frame 38/64]
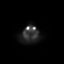
[frame 48/64]
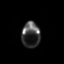
[frame 59/64]
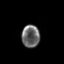

[Series 2: 1 hr ant · 4.14mm/px · 1 of 1 slices shown]
[im 1/1]
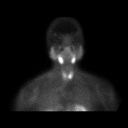

[Series 3: 2 hr ant · 4.14mm/px · 1 of 1 slices shown]
[im 1/1]
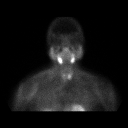

[Series 4: spect parathyroid · 8.28mm/px · 6 of 64 frames shown]
[frame 6/64]
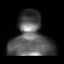
[frame 16/64]
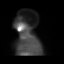
[frame 27/64]
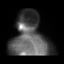
[frame 38/64]
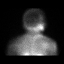
[frame 48/64]
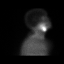
[frame 59/64]
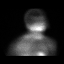

[21 of 21 positions shown; findings below may reference images not displayed]

FINDINGS: On the delayed planar imaging, there is a focus of retained activity
localizing to the mid pole region of the left lobe of thyroid gland.
SPECT imaging of the head and neck localized this activity to the
inferior aspect of the left lobe of thyroid gland.

In examining CT from 01/20/2014 (Tyrique Solis) there is a
rounded nodule measuring 10 mm posterior to the left lobe of thyroid
gland which is felt to represent the same lesion. On the CT exam
this lesion is more towards the superior pole of the left lobe of
the thyroid gland. (Image number 4, series 2).
IMPRESSION: Focal activity localizing to the to the left lobe of the thyroid
gland is consistent with a parathyroid adenoma. On SPECT imaging
lesion appears to be towards the mid to lower pole on the left. On
CT imaging lesion appears to be posterior to the upper pole on the
left.

## 2016-04-02 DIAGNOSIS — I10 Essential (primary) hypertension: Secondary | ICD-10-CM | POA: Diagnosis not present

## 2016-04-02 DIAGNOSIS — L304 Erythema intertrigo: Secondary | ICD-10-CM | POA: Diagnosis not present

## 2016-04-02 DIAGNOSIS — N3000 Acute cystitis without hematuria: Secondary | ICD-10-CM | POA: Diagnosis not present

## 2016-04-02 DIAGNOSIS — E538 Deficiency of other specified B group vitamins: Secondary | ICD-10-CM | POA: Diagnosis not present

## 2016-04-02 DIAGNOSIS — R4181 Age-related cognitive decline: Secondary | ICD-10-CM | POA: Diagnosis not present

## 2016-04-02 DIAGNOSIS — E119 Type 2 diabetes mellitus without complications: Secondary | ICD-10-CM | POA: Diagnosis not present

## 2016-04-02 DIAGNOSIS — N3001 Acute cystitis with hematuria: Secondary | ICD-10-CM | POA: Diagnosis not present

## 2016-04-02 DIAGNOSIS — R5383 Other fatigue: Secondary | ICD-10-CM | POA: Diagnosis not present

## 2016-04-24 DIAGNOSIS — N3000 Acute cystitis without hematuria: Secondary | ICD-10-CM | POA: Diagnosis not present

## 2016-04-24 DIAGNOSIS — N3001 Acute cystitis with hematuria: Secondary | ICD-10-CM | POA: Diagnosis not present

## 2016-06-12 DIAGNOSIS — J00 Acute nasopharyngitis [common cold]: Secondary | ICD-10-CM | POA: Diagnosis not present

## 2016-07-02 DIAGNOSIS — Z1231 Encounter for screening mammogram for malignant neoplasm of breast: Secondary | ICD-10-CM | POA: Diagnosis not present

## 2016-07-08 DIAGNOSIS — R4181 Age-related cognitive decline: Secondary | ICD-10-CM | POA: Diagnosis not present

## 2016-09-10 DIAGNOSIS — N390 Urinary tract infection, site not specified: Secondary | ICD-10-CM | POA: Diagnosis not present

## 2016-09-10 DIAGNOSIS — J180 Bronchopneumonia, unspecified organism: Secondary | ICD-10-CM | POA: Diagnosis not present

## 2016-09-10 DIAGNOSIS — R05 Cough: Secondary | ICD-10-CM | POA: Diagnosis not present

## 2016-09-10 DIAGNOSIS — R918 Other nonspecific abnormal finding of lung field: Secondary | ICD-10-CM | POA: Diagnosis not present

## 2016-10-10 DIAGNOSIS — E1142 Type 2 diabetes mellitus with diabetic polyneuropathy: Secondary | ICD-10-CM | POA: Diagnosis not present

## 2016-10-10 DIAGNOSIS — I10 Essential (primary) hypertension: Secondary | ICD-10-CM | POA: Diagnosis not present

## 2016-10-10 DIAGNOSIS — E782 Mixed hyperlipidemia: Secondary | ICD-10-CM | POA: Diagnosis not present

## 2016-10-10 DIAGNOSIS — R4181 Age-related cognitive decline: Secondary | ICD-10-CM | POA: Diagnosis not present

## 2016-10-17 DIAGNOSIS — F411 Generalized anxiety disorder: Secondary | ICD-10-CM | POA: Diagnosis not present

## 2016-10-17 DIAGNOSIS — E785 Hyperlipidemia, unspecified: Secondary | ICD-10-CM | POA: Diagnosis not present

## 2016-10-17 DIAGNOSIS — G3184 Mild cognitive impairment, so stated: Secondary | ICD-10-CM | POA: Diagnosis not present

## 2016-10-17 DIAGNOSIS — K219 Gastro-esophageal reflux disease without esophagitis: Secondary | ICD-10-CM | POA: Diagnosis not present

## 2016-10-17 DIAGNOSIS — I119 Hypertensive heart disease without heart failure: Secondary | ICD-10-CM | POA: Diagnosis not present

## 2016-10-17 DIAGNOSIS — E1142 Type 2 diabetes mellitus with diabetic polyneuropathy: Secondary | ICD-10-CM | POA: Diagnosis not present

## 2016-11-09 DIAGNOSIS — R079 Chest pain, unspecified: Secondary | ICD-10-CM | POA: Diagnosis not present

## 2016-11-09 DIAGNOSIS — N390 Urinary tract infection, site not specified: Secondary | ICD-10-CM | POA: Diagnosis not present

## 2016-11-09 DIAGNOSIS — E1165 Type 2 diabetes mellitus with hyperglycemia: Secondary | ICD-10-CM | POA: Diagnosis not present

## 2016-11-09 DIAGNOSIS — I10 Essential (primary) hypertension: Secondary | ICD-10-CM | POA: Diagnosis not present

## 2016-11-10 DIAGNOSIS — N390 Urinary tract infection, site not specified: Secondary | ICD-10-CM | POA: Diagnosis not present

## 2016-11-10 DIAGNOSIS — K5710 Diverticulosis of small intestine without perforation or abscess without bleeding: Secondary | ICD-10-CM | POA: Diagnosis not present

## 2016-11-10 DIAGNOSIS — N3000 Acute cystitis without hematuria: Secondary | ICD-10-CM | POA: Diagnosis not present

## 2016-11-10 DIAGNOSIS — R112 Nausea with vomiting, unspecified: Secondary | ICD-10-CM | POA: Diagnosis not present

## 2016-11-10 DIAGNOSIS — Z79899 Other long term (current) drug therapy: Secondary | ICD-10-CM | POA: Diagnosis not present

## 2016-11-10 DIAGNOSIS — I10 Essential (primary) hypertension: Secondary | ICD-10-CM | POA: Diagnosis not present

## 2016-11-10 DIAGNOSIS — K5900 Constipation, unspecified: Secondary | ICD-10-CM | POA: Diagnosis not present

## 2016-11-10 DIAGNOSIS — Z880 Allergy status to penicillin: Secondary | ICD-10-CM | POA: Diagnosis not present

## 2016-11-10 DIAGNOSIS — E1165 Type 2 diabetes mellitus with hyperglycemia: Secondary | ICD-10-CM | POA: Diagnosis not present

## 2016-11-10 DIAGNOSIS — F0390 Unspecified dementia without behavioral disturbance: Secondary | ICD-10-CM | POA: Diagnosis not present

## 2016-11-10 DIAGNOSIS — E876 Hypokalemia: Secondary | ICD-10-CM | POA: Diagnosis not present

## 2016-11-11 DIAGNOSIS — N390 Urinary tract infection, site not specified: Secondary | ICD-10-CM | POA: Diagnosis not present

## 2016-11-11 DIAGNOSIS — R112 Nausea with vomiting, unspecified: Secondary | ICD-10-CM | POA: Diagnosis not present

## 2016-11-11 DIAGNOSIS — I10 Essential (primary) hypertension: Secondary | ICD-10-CM | POA: Diagnosis not present

## 2016-11-11 DIAGNOSIS — E1165 Type 2 diabetes mellitus with hyperglycemia: Secondary | ICD-10-CM | POA: Diagnosis not present

## 2016-11-11 DIAGNOSIS — F0390 Unspecified dementia without behavioral disturbance: Secondary | ICD-10-CM | POA: Diagnosis not present

## 2016-11-19 DIAGNOSIS — R630 Anorexia: Secondary | ICD-10-CM | POA: Diagnosis not present

## 2016-11-19 DIAGNOSIS — R634 Abnormal weight loss: Secondary | ICD-10-CM | POA: Diagnosis not present

## 2016-11-19 DIAGNOSIS — N10 Acute tubulo-interstitial nephritis: Secondary | ICD-10-CM | POA: Diagnosis not present

## 2016-11-19 DIAGNOSIS — N170 Acute kidney failure with tubular necrosis: Secondary | ICD-10-CM | POA: Diagnosis not present

## 2016-11-25 DIAGNOSIS — R630 Anorexia: Secondary | ICD-10-CM | POA: Diagnosis not present

## 2016-11-25 DIAGNOSIS — F331 Major depressive disorder, recurrent, moderate: Secondary | ICD-10-CM | POA: Diagnosis not present

## 2016-11-25 DIAGNOSIS — J029 Acute pharyngitis, unspecified: Secondary | ICD-10-CM | POA: Diagnosis not present

## 2016-11-25 DIAGNOSIS — G3184 Mild cognitive impairment, so stated: Secondary | ICD-10-CM | POA: Diagnosis not present

## 2016-11-25 DIAGNOSIS — R634 Abnormal weight loss: Secondary | ICD-10-CM | POA: Diagnosis not present

## 2016-12-19 DIAGNOSIS — R634 Abnormal weight loss: Secondary | ICD-10-CM | POA: Diagnosis not present

## 2016-12-19 DIAGNOSIS — G3184 Mild cognitive impairment, so stated: Secondary | ICD-10-CM | POA: Diagnosis not present

## 2016-12-19 DIAGNOSIS — B952 Enterococcus as the cause of diseases classified elsewhere: Secondary | ICD-10-CM | POA: Diagnosis not present

## 2016-12-19 DIAGNOSIS — N390 Urinary tract infection, site not specified: Secondary | ICD-10-CM | POA: Diagnosis not present

## 2016-12-19 DIAGNOSIS — F331 Major depressive disorder, recurrent, moderate: Secondary | ICD-10-CM | POA: Diagnosis not present

## 2016-12-19 DIAGNOSIS — R627 Adult failure to thrive: Secondary | ICD-10-CM | POA: Diagnosis not present

## 2016-12-19 DIAGNOSIS — R3121 Asymptomatic microscopic hematuria: Secondary | ICD-10-CM | POA: Diagnosis not present

## 2016-12-19 DIAGNOSIS — Z23 Encounter for immunization: Secondary | ICD-10-CM | POA: Diagnosis not present

## 2016-12-19 DIAGNOSIS — R630 Anorexia: Secondary | ICD-10-CM | POA: Diagnosis not present

## 2016-12-23 DIAGNOSIS — R531 Weakness: Secondary | ICD-10-CM | POA: Diagnosis not present

## 2016-12-23 DIAGNOSIS — J309 Allergic rhinitis, unspecified: Secondary | ICD-10-CM | POA: Diagnosis present

## 2016-12-23 DIAGNOSIS — N179 Acute kidney failure, unspecified: Secondary | ICD-10-CM | POA: Diagnosis not present

## 2016-12-23 DIAGNOSIS — N390 Urinary tract infection, site not specified: Secondary | ICD-10-CM | POA: Diagnosis not present

## 2016-12-23 DIAGNOSIS — Z515 Encounter for palliative care: Secondary | ICD-10-CM | POA: Diagnosis not present

## 2016-12-23 DIAGNOSIS — R0602 Shortness of breath: Secondary | ICD-10-CM | POA: Diagnosis not present

## 2016-12-23 DIAGNOSIS — F332 Major depressive disorder, recurrent severe without psychotic features: Secondary | ICD-10-CM | POA: Diagnosis not present

## 2016-12-23 DIAGNOSIS — K5790 Diverticulosis of intestine, part unspecified, without perforation or abscess without bleeding: Secondary | ICD-10-CM | POA: Diagnosis not present

## 2016-12-23 DIAGNOSIS — R100 Acute abdomen: Secondary | ICD-10-CM | POA: Diagnosis not present

## 2016-12-23 DIAGNOSIS — R109 Unspecified abdominal pain: Secondary | ICD-10-CM | POA: Diagnosis not present

## 2016-12-23 DIAGNOSIS — Z818 Family history of other mental and behavioral disorders: Secondary | ICD-10-CM | POA: Diagnosis not present

## 2016-12-23 DIAGNOSIS — E785 Hyperlipidemia, unspecified: Secondary | ICD-10-CM | POA: Diagnosis present

## 2016-12-23 DIAGNOSIS — Z8679 Personal history of other diseases of the circulatory system: Secondary | ICD-10-CM | POA: Diagnosis not present

## 2016-12-23 DIAGNOSIS — Z881 Allergy status to other antibiotic agents status: Secondary | ICD-10-CM | POA: Diagnosis not present

## 2016-12-23 DIAGNOSIS — Z4682 Encounter for fitting and adjustment of non-vascular catheter: Secondary | ICD-10-CM | POA: Diagnosis not present

## 2016-12-23 DIAGNOSIS — J811 Chronic pulmonary edema: Secondary | ICD-10-CM | POA: Diagnosis not present

## 2016-12-23 DIAGNOSIS — R4182 Altered mental status, unspecified: Secondary | ICD-10-CM | POA: Diagnosis not present

## 2016-12-23 DIAGNOSIS — J90 Pleural effusion, not elsewhere classified: Secondary | ICD-10-CM | POA: Diagnosis not present

## 2016-12-23 DIAGNOSIS — K265 Chronic or unspecified duodenal ulcer with perforation: Secondary | ICD-10-CM | POA: Diagnosis not present

## 2016-12-23 DIAGNOSIS — E441 Mild protein-calorie malnutrition: Secondary | ICD-10-CM | POA: Diagnosis present

## 2016-12-23 DIAGNOSIS — K219 Gastro-esophageal reflux disease without esophagitis: Secondary | ICD-10-CM | POA: Diagnosis present

## 2016-12-23 DIAGNOSIS — I517 Cardiomegaly: Secondary | ICD-10-CM | POA: Diagnosis not present

## 2016-12-23 DIAGNOSIS — E876 Hypokalemia: Secondary | ICD-10-CM | POA: Diagnosis present

## 2016-12-23 DIAGNOSIS — J9811 Atelectasis: Secondary | ICD-10-CM | POA: Diagnosis not present

## 2016-12-23 DIAGNOSIS — E782 Mixed hyperlipidemia: Secondary | ICD-10-CM | POA: Diagnosis not present

## 2016-12-23 DIAGNOSIS — F322 Major depressive disorder, single episode, severe without psychotic features: Secondary | ICD-10-CM | POA: Diagnosis not present

## 2016-12-23 DIAGNOSIS — R05 Cough: Secondary | ICD-10-CM | POA: Diagnosis not present

## 2016-12-23 DIAGNOSIS — K261 Acute duodenal ulcer with perforation: Secondary | ICD-10-CM | POA: Diagnosis not present

## 2016-12-23 DIAGNOSIS — I499 Cardiac arrhythmia, unspecified: Secondary | ICD-10-CM | POA: Diagnosis not present

## 2016-12-23 DIAGNOSIS — E1122 Type 2 diabetes mellitus with diabetic chronic kidney disease: Secondary | ICD-10-CM | POA: Diagnosis not present

## 2016-12-23 DIAGNOSIS — I480 Paroxysmal atrial fibrillation: Secondary | ICD-10-CM | POA: Diagnosis not present

## 2016-12-23 DIAGNOSIS — F329 Major depressive disorder, single episode, unspecified: Secondary | ICD-10-CM | POA: Diagnosis not present

## 2016-12-23 DIAGNOSIS — I9589 Other hypotension: Secondary | ICD-10-CM | POA: Diagnosis not present

## 2016-12-23 DIAGNOSIS — R0989 Other specified symptoms and signs involving the circulatory and respiratory systems: Secondary | ICD-10-CM | POA: Diagnosis not present

## 2016-12-23 DIAGNOSIS — Z452 Encounter for adjustment and management of vascular access device: Secondary | ICD-10-CM | POA: Diagnosis not present

## 2016-12-23 DIAGNOSIS — E119 Type 2 diabetes mellitus without complications: Secondary | ICD-10-CM | POA: Diagnosis present

## 2016-12-23 DIAGNOSIS — Z6820 Body mass index (BMI) 20.0-20.9, adult: Secondary | ICD-10-CM | POA: Diagnosis not present

## 2016-12-23 DIAGNOSIS — A419 Sepsis, unspecified organism: Secondary | ICD-10-CM | POA: Diagnosis not present

## 2016-12-23 DIAGNOSIS — N183 Chronic kidney disease, stage 3 (moderate): Secondary | ICD-10-CM | POA: Diagnosis not present

## 2016-12-23 DIAGNOSIS — Z7189 Other specified counseling: Secondary | ICD-10-CM | POA: Diagnosis not present

## 2016-12-23 DIAGNOSIS — I10 Essential (primary) hypertension: Secondary | ICD-10-CM | POA: Diagnosis present

## 2016-12-23 DIAGNOSIS — I77810 Thoracic aortic ectasia: Secondary | ICD-10-CM | POA: Diagnosis not present

## 2016-12-23 DIAGNOSIS — E1100 Type 2 diabetes mellitus with hyperosmolarity without nonketotic hyperglycemic-hyperosmolar coma (NKHHC): Secondary | ICD-10-CM | POA: Diagnosis not present

## 2016-12-23 DIAGNOSIS — I358 Other nonrheumatic aortic valve disorders: Secondary | ICD-10-CM | POA: Diagnosis not present

## 2016-12-23 DIAGNOSIS — K388 Other specified diseases of appendix: Secondary | ICD-10-CM | POA: Diagnosis not present

## 2016-12-23 DIAGNOSIS — K828 Other specified diseases of gallbladder: Secondary | ICD-10-CM | POA: Diagnosis not present

## 2016-12-23 DIAGNOSIS — R918 Other nonspecific abnormal finding of lung field: Secondary | ICD-10-CM | POA: Diagnosis not present

## 2016-12-23 DIAGNOSIS — R627 Adult failure to thrive: Secondary | ICD-10-CM | POA: Diagnosis present

## 2016-12-23 DIAGNOSIS — E861 Hypovolemia: Secondary | ICD-10-CM | POA: Diagnosis not present

## 2016-12-23 DIAGNOSIS — R188 Other ascites: Secondary | ICD-10-CM | POA: Diagnosis not present

## 2016-12-23 DIAGNOSIS — K5900 Constipation, unspecified: Secondary | ICD-10-CM | POA: Diagnosis not present

## 2016-12-23 DIAGNOSIS — K659 Peritonitis, unspecified: Secondary | ICD-10-CM | POA: Diagnosis not present

## 2016-12-23 DIAGNOSIS — K631 Perforation of intestine (nontraumatic): Secondary | ICD-10-CM | POA: Diagnosis not present

## 2016-12-26 DIAGNOSIS — K261 Acute duodenal ulcer with perforation: Secondary | ICD-10-CM | POA: Diagnosis not present

## 2016-12-26 DIAGNOSIS — A419 Sepsis, unspecified organism: Secondary | ICD-10-CM | POA: Diagnosis not present

## 2016-12-26 DIAGNOSIS — K659 Peritonitis, unspecified: Secondary | ICD-10-CM | POA: Diagnosis not present

## 2017-01-02 DIAGNOSIS — I82A11 Acute embolism and thrombosis of right axillary vein: Secondary | ICD-10-CM | POA: Diagnosis not present

## 2017-01-02 DIAGNOSIS — N183 Chronic kidney disease, stage 3 (moderate): Secondary | ICD-10-CM | POA: Diagnosis present

## 2017-01-02 DIAGNOSIS — E1122 Type 2 diabetes mellitus with diabetic chronic kidney disease: Secondary | ICD-10-CM | POA: Diagnosis present

## 2017-01-02 DIAGNOSIS — E1100 Type 2 diabetes mellitus with hyperosmolarity without nonketotic hyperglycemic-hyperosmolar coma (NKHHC): Secondary | ICD-10-CM | POA: Diagnosis not present

## 2017-01-02 DIAGNOSIS — I4891 Unspecified atrial fibrillation: Secondary | ICD-10-CM | POA: Diagnosis not present

## 2017-01-02 DIAGNOSIS — B49 Unspecified mycosis: Secondary | ICD-10-CM | POA: Diagnosis not present

## 2017-01-02 DIAGNOSIS — Z452 Encounter for adjustment and management of vascular access device: Secondary | ICD-10-CM | POA: Diagnosis not present

## 2017-01-02 DIAGNOSIS — J309 Allergic rhinitis, unspecified: Secondary | ICD-10-CM | POA: Diagnosis not present

## 2017-01-02 DIAGNOSIS — Z4682 Encounter for fitting and adjustment of non-vascular catheter: Secondary | ICD-10-CM | POA: Diagnosis not present

## 2017-01-02 DIAGNOSIS — B379 Candidiasis, unspecified: Secondary | ICD-10-CM | POA: Diagnosis not present

## 2017-01-02 DIAGNOSIS — K265 Chronic or unspecified duodenal ulcer with perforation: Secondary | ICD-10-CM | POA: Diagnosis present

## 2017-01-02 DIAGNOSIS — F329 Major depressive disorder, single episode, unspecified: Secondary | ICD-10-CM | POA: Diagnosis present

## 2017-01-02 DIAGNOSIS — E785 Hyperlipidemia, unspecified: Secondary | ICD-10-CM | POA: Diagnosis not present

## 2017-01-02 DIAGNOSIS — J90 Pleural effusion, not elsewhere classified: Secondary | ICD-10-CM | POA: Diagnosis present

## 2017-01-02 DIAGNOSIS — I700 Atherosclerosis of aorta: Secondary | ICD-10-CM | POA: Diagnosis not present

## 2017-01-02 DIAGNOSIS — Z48815 Encounter for surgical aftercare following surgery on the digestive system: Secondary | ICD-10-CM | POA: Diagnosis not present

## 2017-01-02 DIAGNOSIS — R109 Unspecified abdominal pain: Secondary | ICD-10-CM | POA: Diagnosis not present

## 2017-01-02 DIAGNOSIS — F0390 Unspecified dementia without behavioral disturbance: Secondary | ICD-10-CM | POA: Diagnosis not present

## 2017-01-02 DIAGNOSIS — A4189 Other specified sepsis: Secondary | ICD-10-CM | POA: Diagnosis not present

## 2017-01-02 DIAGNOSIS — E876 Hypokalemia: Secondary | ICD-10-CM | POA: Diagnosis present

## 2017-01-02 DIAGNOSIS — K631 Perforation of intestine (nontraumatic): Secondary | ICD-10-CM | POA: Diagnosis not present

## 2017-01-02 DIAGNOSIS — Z515 Encounter for palliative care: Secondary | ICD-10-CM | POA: Diagnosis not present

## 2017-01-02 DIAGNOSIS — R627 Adult failure to thrive: Secondary | ICD-10-CM | POA: Diagnosis not present

## 2017-01-02 DIAGNOSIS — E11649 Type 2 diabetes mellitus with hypoglycemia without coma: Secondary | ICD-10-CM | POA: Diagnosis not present

## 2017-01-02 DIAGNOSIS — J95821 Acute postprocedural respiratory failure: Secondary | ICD-10-CM | POA: Diagnosis not present

## 2017-01-02 DIAGNOSIS — R911 Solitary pulmonary nodule: Secondary | ICD-10-CM | POA: Diagnosis not present

## 2017-01-02 DIAGNOSIS — I82B11 Acute embolism and thrombosis of right subclavian vein: Secondary | ICD-10-CM | POA: Diagnosis not present

## 2017-01-02 DIAGNOSIS — Z6824 Body mass index (BMI) 24.0-24.9, adult: Secondary | ICD-10-CM | POA: Diagnosis not present

## 2017-01-02 DIAGNOSIS — N390 Urinary tract infection, site not specified: Secondary | ICD-10-CM | POA: Diagnosis not present

## 2017-01-02 DIAGNOSIS — K5900 Constipation, unspecified: Secondary | ICD-10-CM | POA: Diagnosis not present

## 2017-01-02 DIAGNOSIS — B3789 Other sites of candidiasis: Secondary | ICD-10-CM | POA: Diagnosis present

## 2017-01-02 DIAGNOSIS — Z66 Do not resuscitate: Secondary | ICD-10-CM | POA: Diagnosis not present

## 2017-01-02 DIAGNOSIS — Z7982 Long term (current) use of aspirin: Secondary | ICD-10-CM | POA: Diagnosis not present

## 2017-01-02 DIAGNOSIS — I77819 Aortic ectasia, unspecified site: Secondary | ICD-10-CM | POA: Diagnosis not present

## 2017-01-02 DIAGNOSIS — Z7409 Other reduced mobility: Secondary | ICD-10-CM | POA: Diagnosis not present

## 2017-01-02 DIAGNOSIS — I82611 Acute embolism and thrombosis of superficial veins of right upper extremity: Secondary | ICD-10-CM | POA: Diagnosis not present

## 2017-01-02 DIAGNOSIS — L8990 Pressure ulcer of unspecified site, unspecified stage: Secondary | ICD-10-CM | POA: Diagnosis not present

## 2017-01-02 DIAGNOSIS — I82621 Acute embolism and thrombosis of deep veins of right upper extremity: Secondary | ICD-10-CM | POA: Diagnosis not present

## 2017-01-02 DIAGNOSIS — I10 Essential (primary) hypertension: Secondary | ICD-10-CM | POA: Diagnosis not present

## 2017-01-02 DIAGNOSIS — E782 Mixed hyperlipidemia: Secondary | ICD-10-CM | POA: Diagnosis not present

## 2017-01-02 DIAGNOSIS — Z7984 Long term (current) use of oral hypoglycemic drugs: Secondary | ICD-10-CM | POA: Diagnosis not present

## 2017-01-02 DIAGNOSIS — I480 Paroxysmal atrial fibrillation: Secondary | ICD-10-CM | POA: Diagnosis present

## 2017-01-02 DIAGNOSIS — A419 Sepsis, unspecified organism: Secondary | ICD-10-CM | POA: Diagnosis present

## 2017-01-02 DIAGNOSIS — Z79899 Other long term (current) drug therapy: Secondary | ICD-10-CM | POA: Diagnosis not present

## 2017-01-02 DIAGNOSIS — I129 Hypertensive chronic kidney disease with stage 1 through stage 4 chronic kidney disease, or unspecified chronic kidney disease: Secondary | ICD-10-CM | POA: Diagnosis present

## 2017-01-02 DIAGNOSIS — K660 Peritoneal adhesions (postprocedural) (postinfection): Secondary | ICD-10-CM | POA: Diagnosis present

## 2017-01-02 DIAGNOSIS — T82868A Thrombosis of vascular prosthetic devices, implants and grafts, initial encounter: Secondary | ICD-10-CM | POA: Diagnosis not present

## 2017-01-02 DIAGNOSIS — F4323 Adjustment disorder with mixed anxiety and depressed mood: Secondary | ICD-10-CM | POA: Diagnosis not present

## 2017-01-02 DIAGNOSIS — E119 Type 2 diabetes mellitus without complications: Secondary | ICD-10-CM | POA: Diagnosis not present

## 2017-01-02 DIAGNOSIS — J984 Other disorders of lung: Secondary | ICD-10-CM | POA: Diagnosis not present

## 2017-01-02 DIAGNOSIS — K651 Peritoneal abscess: Secondary | ICD-10-CM | POA: Diagnosis not present

## 2017-01-02 DIAGNOSIS — K658 Other peritonitis: Secondary | ICD-10-CM | POA: Diagnosis present

## 2017-01-02 DIAGNOSIS — E441 Mild protein-calorie malnutrition: Secondary | ICD-10-CM | POA: Diagnosis present

## 2017-01-02 DIAGNOSIS — K659 Peritonitis, unspecified: Secondary | ICD-10-CM | POA: Diagnosis not present

## 2017-01-02 DIAGNOSIS — K5790 Diverticulosis of intestine, part unspecified, without perforation or abscess without bleeding: Secondary | ICD-10-CM | POA: Diagnosis not present

## 2017-01-02 DIAGNOSIS — R918 Other nonspecific abnormal finding of lung field: Secondary | ICD-10-CM | POA: Diagnosis present

## 2017-01-02 DIAGNOSIS — J811 Chronic pulmonary edema: Secondary | ICD-10-CM | POA: Diagnosis not present

## 2017-01-02 DIAGNOSIS — J9811 Atelectasis: Secondary | ICD-10-CM | POA: Diagnosis not present

## 2017-01-02 DIAGNOSIS — N179 Acute kidney failure, unspecified: Secondary | ICD-10-CM | POA: Diagnosis present

## 2017-01-02 DIAGNOSIS — K219 Gastro-esophageal reflux disease without esophagitis: Secondary | ICD-10-CM | POA: Diagnosis not present

## 2017-01-14 DIAGNOSIS — M6281 Muscle weakness (generalized): Secondary | ICD-10-CM | POA: Diagnosis not present

## 2017-01-14 DIAGNOSIS — Z7901 Long term (current) use of anticoagulants: Secondary | ICD-10-CM | POA: Diagnosis not present

## 2017-01-14 DIAGNOSIS — I4891 Unspecified atrial fibrillation: Secondary | ICD-10-CM | POA: Diagnosis not present

## 2017-01-14 DIAGNOSIS — K449 Diaphragmatic hernia without obstruction or gangrene: Secondary | ICD-10-CM | POA: Diagnosis not present

## 2017-01-14 DIAGNOSIS — F0390 Unspecified dementia without behavioral disturbance: Secondary | ICD-10-CM | POA: Diagnosis not present

## 2017-01-14 DIAGNOSIS — K222 Esophageal obstruction: Secondary | ICD-10-CM | POA: Diagnosis not present

## 2017-01-14 DIAGNOSIS — I10 Essential (primary) hypertension: Secondary | ICD-10-CM | POA: Diagnosis not present

## 2017-01-14 DIAGNOSIS — R1319 Other dysphagia: Secondary | ICD-10-CM | POA: Diagnosis not present

## 2017-01-14 DIAGNOSIS — K219 Gastro-esophageal reflux disease without esophagitis: Secondary | ICD-10-CM | POA: Diagnosis not present

## 2017-01-14 DIAGNOSIS — N281 Cyst of kidney, acquired: Secondary | ICD-10-CM | POA: Diagnosis not present

## 2017-01-14 DIAGNOSIS — I517 Cardiomegaly: Secondary | ICD-10-CM | POA: Diagnosis not present

## 2017-01-14 DIAGNOSIS — D72829 Elevated white blood cell count, unspecified: Secondary | ICD-10-CM | POA: Diagnosis not present

## 2017-01-14 DIAGNOSIS — R918 Other nonspecific abnormal finding of lung field: Secondary | ICD-10-CM | POA: Diagnosis not present

## 2017-01-14 DIAGNOSIS — E441 Mild protein-calorie malnutrition: Secondary | ICD-10-CM | POA: Diagnosis not present

## 2017-01-14 DIAGNOSIS — K631 Perforation of intestine (nontraumatic): Secondary | ICD-10-CM | POA: Diagnosis not present

## 2017-01-14 DIAGNOSIS — E876 Hypokalemia: Secondary | ICD-10-CM | POA: Diagnosis not present

## 2017-01-14 DIAGNOSIS — E119 Type 2 diabetes mellitus without complications: Secondary | ICD-10-CM | POA: Diagnosis not present

## 2017-01-14 DIAGNOSIS — R9389 Abnormal findings on diagnostic imaging of other specified body structures: Secondary | ICD-10-CM | POA: Diagnosis not present

## 2017-01-14 DIAGNOSIS — R1310 Dysphagia, unspecified: Secondary | ICD-10-CM | POA: Diagnosis not present

## 2017-01-14 DIAGNOSIS — F329 Major depressive disorder, single episode, unspecified: Secondary | ICD-10-CM | POA: Diagnosis not present

## 2017-01-14 DIAGNOSIS — T8189XD Other complications of procedures, not elsewhere classified, subsequent encounter: Secondary | ICD-10-CM | POA: Diagnosis not present

## 2017-01-14 DIAGNOSIS — R112 Nausea with vomiting, unspecified: Secondary | ICD-10-CM | POA: Diagnosis not present

## 2017-01-14 DIAGNOSIS — N179 Acute kidney failure, unspecified: Secondary | ICD-10-CM | POA: Diagnosis not present

## 2017-01-14 DIAGNOSIS — E1122 Type 2 diabetes mellitus with diabetic chronic kidney disease: Secondary | ICD-10-CM | POA: Diagnosis not present

## 2017-01-14 DIAGNOSIS — N390 Urinary tract infection, site not specified: Secondary | ICD-10-CM | POA: Diagnosis not present

## 2017-01-14 DIAGNOSIS — T8140XA Infection following a procedure, unspecified, initial encounter: Secondary | ICD-10-CM | POA: Diagnosis not present

## 2017-01-14 DIAGNOSIS — R1084 Generalized abdominal pain: Secondary | ICD-10-CM | POA: Diagnosis not present

## 2017-01-14 DIAGNOSIS — K5710 Diverticulosis of small intestine without perforation or abscess without bleeding: Secondary | ICD-10-CM | POA: Diagnosis not present

## 2017-01-14 DIAGNOSIS — Z7982 Long term (current) use of aspirin: Secondary | ICD-10-CM | POA: Diagnosis not present

## 2017-01-14 DIAGNOSIS — R188 Other ascites: Secondary | ICD-10-CM | POA: Diagnosis not present

## 2017-01-14 DIAGNOSIS — R911 Solitary pulmonary nodule: Secondary | ICD-10-CM | POA: Diagnosis not present

## 2017-01-14 DIAGNOSIS — L8990 Pressure ulcer of unspecified site, unspecified stage: Secondary | ICD-10-CM | POA: Diagnosis not present

## 2017-01-14 DIAGNOSIS — E46 Unspecified protein-calorie malnutrition: Secondary | ICD-10-CM | POA: Diagnosis not present

## 2017-01-14 DIAGNOSIS — F4323 Adjustment disorder with mixed anxiety and depressed mood: Secondary | ICD-10-CM | POA: Diagnosis not present

## 2017-01-14 DIAGNOSIS — E785 Hyperlipidemia, unspecified: Secondary | ICD-10-CM | POA: Diagnosis not present

## 2017-01-14 DIAGNOSIS — Z7984 Long term (current) use of oral hypoglycemic drugs: Secondary | ICD-10-CM | POA: Diagnosis not present

## 2017-01-14 DIAGNOSIS — I2510 Atherosclerotic heart disease of native coronary artery without angina pectoris: Secondary | ICD-10-CM | POA: Diagnosis not present

## 2017-01-14 DIAGNOSIS — Z880 Allergy status to penicillin: Secondary | ICD-10-CM | POA: Diagnosis not present

## 2017-01-14 DIAGNOSIS — K265 Chronic or unspecified duodenal ulcer with perforation: Secondary | ICD-10-CM | POA: Diagnosis not present

## 2017-01-14 DIAGNOSIS — K5900 Constipation, unspecified: Secondary | ICD-10-CM | POA: Diagnosis not present

## 2017-01-14 DIAGNOSIS — Z79899 Other long term (current) drug therapy: Secondary | ICD-10-CM | POA: Diagnosis not present

## 2017-01-14 DIAGNOSIS — K314 Gastric diverticulum: Secondary | ICD-10-CM | POA: Diagnosis not present

## 2017-01-14 DIAGNOSIS — R1110 Vomiting, unspecified: Secondary | ICD-10-CM | POA: Diagnosis not present

## 2017-01-14 DIAGNOSIS — N183 Chronic kidney disease, stage 3 (moderate): Secondary | ICD-10-CM | POA: Diagnosis not present

## 2017-01-14 DIAGNOSIS — Z7409 Other reduced mobility: Secondary | ICD-10-CM | POA: Diagnosis not present

## 2017-01-14 DIAGNOSIS — R109 Unspecified abdominal pain: Secondary | ICD-10-CM | POA: Diagnosis not present

## 2017-01-14 DIAGNOSIS — K224 Dyskinesia of esophagus: Secondary | ICD-10-CM | POA: Diagnosis not present

## 2017-01-14 DIAGNOSIS — I480 Paroxysmal atrial fibrillation: Secondary | ICD-10-CM | POA: Diagnosis not present

## 2017-01-14 DIAGNOSIS — I119 Hypertensive heart disease without heart failure: Secondary | ICD-10-CM | POA: Diagnosis not present

## 2017-01-14 DIAGNOSIS — J309 Allergic rhinitis, unspecified: Secondary | ICD-10-CM | POA: Diagnosis not present

## 2017-01-14 DIAGNOSIS — Z48815 Encounter for surgical aftercare following surgery on the digestive system: Secondary | ICD-10-CM | POA: Diagnosis not present

## 2017-01-14 DIAGNOSIS — Z881 Allergy status to other antibiotic agents status: Secondary | ICD-10-CM | POA: Diagnosis not present

## 2017-01-14 DIAGNOSIS — I129 Hypertensive chronic kidney disease with stage 1 through stage 4 chronic kidney disease, or unspecified chronic kidney disease: Secondary | ICD-10-CM | POA: Diagnosis not present

## 2017-01-14 DIAGNOSIS — Y839 Surgical procedure, unspecified as the cause of abnormal reaction of the patient, or of later complication, without mention of misadventure at the time of the procedure: Secondary | ICD-10-CM | POA: Diagnosis not present

## 2017-01-14 DIAGNOSIS — Z9889 Other specified postprocedural states: Secondary | ICD-10-CM | POA: Diagnosis not present

## 2017-01-14 DIAGNOSIS — R627 Adult failure to thrive: Secondary | ICD-10-CM | POA: Diagnosis not present

## 2017-01-15 DIAGNOSIS — N183 Chronic kidney disease, stage 3 (moderate): Secondary | ICD-10-CM | POA: Diagnosis not present

## 2017-01-15 DIAGNOSIS — I480 Paroxysmal atrial fibrillation: Secondary | ICD-10-CM | POA: Diagnosis not present

## 2017-01-15 DIAGNOSIS — I10 Essential (primary) hypertension: Secondary | ICD-10-CM | POA: Diagnosis not present

## 2017-01-15 DIAGNOSIS — K631 Perforation of intestine (nontraumatic): Secondary | ICD-10-CM | POA: Diagnosis not present

## 2017-01-17 DIAGNOSIS — K5900 Constipation, unspecified: Secondary | ICD-10-CM | POA: Diagnosis not present

## 2017-01-17 DIAGNOSIS — D72829 Elevated white blood cell count, unspecified: Secondary | ICD-10-CM | POA: Diagnosis not present

## 2017-01-22 DIAGNOSIS — I10 Essential (primary) hypertension: Secondary | ICD-10-CM | POA: Diagnosis not present

## 2017-01-22 DIAGNOSIS — K631 Perforation of intestine (nontraumatic): Secondary | ICD-10-CM | POA: Diagnosis not present

## 2017-01-22 DIAGNOSIS — D72829 Elevated white blood cell count, unspecified: Secondary | ICD-10-CM | POA: Diagnosis not present

## 2017-01-22 DIAGNOSIS — N183 Chronic kidney disease, stage 3 (moderate): Secondary | ICD-10-CM | POA: Diagnosis not present

## 2017-01-23 DIAGNOSIS — R112 Nausea with vomiting, unspecified: Secondary | ICD-10-CM | POA: Diagnosis not present

## 2017-01-23 DIAGNOSIS — I119 Hypertensive heart disease without heart failure: Secondary | ICD-10-CM | POA: Diagnosis not present

## 2017-01-23 DIAGNOSIS — R109 Unspecified abdominal pain: Secondary | ICD-10-CM | POA: Diagnosis not present

## 2017-01-23 DIAGNOSIS — E119 Type 2 diabetes mellitus without complications: Secondary | ICD-10-CM | POA: Diagnosis not present

## 2017-01-23 DIAGNOSIS — Z9889 Other specified postprocedural states: Secondary | ICD-10-CM | POA: Diagnosis not present

## 2017-01-23 DIAGNOSIS — R918 Other nonspecific abnormal finding of lung field: Secondary | ICD-10-CM | POA: Diagnosis not present

## 2017-01-23 DIAGNOSIS — R188 Other ascites: Secondary | ICD-10-CM | POA: Diagnosis not present

## 2017-01-23 DIAGNOSIS — K449 Diaphragmatic hernia without obstruction or gangrene: Secondary | ICD-10-CM | POA: Diagnosis not present

## 2017-01-23 DIAGNOSIS — I2510 Atherosclerotic heart disease of native coronary artery without angina pectoris: Secondary | ICD-10-CM | POA: Diagnosis not present

## 2017-01-23 DIAGNOSIS — E876 Hypokalemia: Secondary | ICD-10-CM | POA: Diagnosis not present

## 2017-01-23 DIAGNOSIS — N281 Cyst of kidney, acquired: Secondary | ICD-10-CM | POA: Diagnosis not present

## 2017-01-23 DIAGNOSIS — T8140XA Infection following a procedure, unspecified, initial encounter: Secondary | ICD-10-CM | POA: Diagnosis not present

## 2017-01-23 DIAGNOSIS — R1084 Generalized abdominal pain: Secondary | ICD-10-CM | POA: Diagnosis not present

## 2017-01-31 DIAGNOSIS — R1310 Dysphagia, unspecified: Secondary | ICD-10-CM | POA: Diagnosis not present

## 2017-01-31 DIAGNOSIS — K265 Chronic or unspecified duodenal ulcer with perforation: Secondary | ICD-10-CM | POA: Diagnosis not present

## 2017-02-03 DIAGNOSIS — K5900 Constipation, unspecified: Secondary | ICD-10-CM | POA: Diagnosis not present

## 2017-02-03 DIAGNOSIS — I10 Essential (primary) hypertension: Secondary | ICD-10-CM | POA: Diagnosis not present

## 2017-02-03 DIAGNOSIS — E46 Unspecified protein-calorie malnutrition: Secondary | ICD-10-CM | POA: Diagnosis not present

## 2017-02-03 DIAGNOSIS — R1110 Vomiting, unspecified: Secondary | ICD-10-CM | POA: Diagnosis not present

## 2017-02-05 DIAGNOSIS — K224 Dyskinesia of esophagus: Secondary | ICD-10-CM | POA: Diagnosis not present

## 2017-02-05 DIAGNOSIS — Z9889 Other specified postprocedural states: Secondary | ICD-10-CM | POA: Diagnosis not present

## 2017-02-05 DIAGNOSIS — K449 Diaphragmatic hernia without obstruction or gangrene: Secondary | ICD-10-CM | POA: Diagnosis not present

## 2017-02-05 DIAGNOSIS — K314 Gastric diverticulum: Secondary | ICD-10-CM | POA: Diagnosis not present

## 2017-02-05 DIAGNOSIS — R1310 Dysphagia, unspecified: Secondary | ICD-10-CM | POA: Diagnosis not present

## 2017-02-05 DIAGNOSIS — K5710 Diverticulosis of small intestine without perforation or abscess without bleeding: Secondary | ICD-10-CM | POA: Diagnosis not present

## 2017-02-06 DIAGNOSIS — I10 Essential (primary) hypertension: Secondary | ICD-10-CM | POA: Diagnosis not present

## 2017-02-06 DIAGNOSIS — R1110 Vomiting, unspecified: Secondary | ICD-10-CM | POA: Diagnosis not present

## 2017-02-06 DIAGNOSIS — K631 Perforation of intestine (nontraumatic): Secondary | ICD-10-CM | POA: Diagnosis not present

## 2017-02-06 DIAGNOSIS — I480 Paroxysmal atrial fibrillation: Secondary | ICD-10-CM | POA: Diagnosis not present

## 2017-02-12 DIAGNOSIS — I10 Essential (primary) hypertension: Secondary | ICD-10-CM | POA: Diagnosis not present

## 2017-02-12 DIAGNOSIS — E46 Unspecified protein-calorie malnutrition: Secondary | ICD-10-CM | POA: Diagnosis not present

## 2017-02-12 DIAGNOSIS — I480 Paroxysmal atrial fibrillation: Secondary | ICD-10-CM | POA: Diagnosis not present

## 2017-02-12 DIAGNOSIS — K631 Perforation of intestine (nontraumatic): Secondary | ICD-10-CM | POA: Diagnosis not present

## 2017-02-13 DIAGNOSIS — R1319 Other dysphagia: Secondary | ICD-10-CM | POA: Diagnosis not present

## 2017-02-19 DIAGNOSIS — R1310 Dysphagia, unspecified: Secondary | ICD-10-CM | POA: Diagnosis not present

## 2017-02-24 DIAGNOSIS — K222 Esophageal obstruction: Secondary | ICD-10-CM | POA: Diagnosis not present

## 2017-02-24 DIAGNOSIS — R1310 Dysphagia, unspecified: Secondary | ICD-10-CM | POA: Diagnosis not present

## 2017-02-28 DIAGNOSIS — T8189XD Other complications of procedures, not elsewhere classified, subsequent encounter: Secondary | ICD-10-CM | POA: Diagnosis not present

## 2017-02-28 DIAGNOSIS — Y839 Surgical procedure, unspecified as the cause of abnormal reaction of the patient, or of later complication, without mention of misadventure at the time of the procedure: Secondary | ICD-10-CM | POA: Diagnosis not present

## 2017-02-28 DIAGNOSIS — R1310 Dysphagia, unspecified: Secondary | ICD-10-CM | POA: Diagnosis not present

## 2017-03-04 DIAGNOSIS — N183 Chronic kidney disease, stage 3 (moderate): Secondary | ICD-10-CM | POA: Diagnosis not present

## 2017-03-04 DIAGNOSIS — R1310 Dysphagia, unspecified: Secondary | ICD-10-CM | POA: Diagnosis not present

## 2017-03-04 DIAGNOSIS — I129 Hypertensive chronic kidney disease with stage 1 through stage 4 chronic kidney disease, or unspecified chronic kidney disease: Secondary | ICD-10-CM | POA: Diagnosis not present

## 2017-03-04 DIAGNOSIS — K222 Esophageal obstruction: Secondary | ICD-10-CM | POA: Diagnosis not present

## 2017-03-04 DIAGNOSIS — E1122 Type 2 diabetes mellitus with diabetic chronic kidney disease: Secondary | ICD-10-CM | POA: Diagnosis not present

## 2017-03-04 DIAGNOSIS — E785 Hyperlipidemia, unspecified: Secondary | ICD-10-CM | POA: Diagnosis not present

## 2017-03-04 DIAGNOSIS — K219 Gastro-esophageal reflux disease without esophagitis: Secondary | ICD-10-CM | POA: Diagnosis not present

## 2017-03-13 DIAGNOSIS — I10 Essential (primary) hypertension: Secondary | ICD-10-CM | POA: Diagnosis not present

## 2017-03-13 DIAGNOSIS — I480 Paroxysmal atrial fibrillation: Secondary | ICD-10-CM | POA: Diagnosis not present

## 2017-03-13 DIAGNOSIS — N183 Chronic kidney disease, stage 3 (moderate): Secondary | ICD-10-CM | POA: Diagnosis not present

## 2017-03-13 DIAGNOSIS — E119 Type 2 diabetes mellitus without complications: Secondary | ICD-10-CM | POA: Diagnosis not present

## 2017-03-17 DIAGNOSIS — L89312 Pressure ulcer of right buttock, stage 2: Secondary | ICD-10-CM | POA: Diagnosis not present

## 2017-03-17 DIAGNOSIS — T8143XD Infection following a procedure, organ and space surgical site, subsequent encounter: Secondary | ICD-10-CM | POA: Diagnosis not present

## 2017-03-19 DIAGNOSIS — T8143XD Infection following a procedure, organ and space surgical site, subsequent encounter: Secondary | ICD-10-CM | POA: Diagnosis not present

## 2017-03-19 DIAGNOSIS — L89312 Pressure ulcer of right buttock, stage 2: Secondary | ICD-10-CM | POA: Diagnosis not present

## 2017-03-20 DIAGNOSIS — F331 Major depressive disorder, recurrent, moderate: Secondary | ICD-10-CM | POA: Diagnosis not present

## 2017-03-20 DIAGNOSIS — K265 Chronic or unspecified duodenal ulcer with perforation: Secondary | ICD-10-CM | POA: Diagnosis not present

## 2017-03-20 DIAGNOSIS — L89619 Pressure ulcer of right heel, unspecified stage: Secondary | ICD-10-CM | POA: Diagnosis not present

## 2017-03-20 DIAGNOSIS — R627 Adult failure to thrive: Secondary | ICD-10-CM | POA: Diagnosis not present

## 2017-03-20 DIAGNOSIS — G3184 Mild cognitive impairment, so stated: Secondary | ICD-10-CM | POA: Diagnosis not present

## 2017-03-20 DIAGNOSIS — F411 Generalized anxiety disorder: Secondary | ICD-10-CM | POA: Diagnosis not present

## 2017-03-20 DIAGNOSIS — L89152 Pressure ulcer of sacral region, stage 2: Secondary | ICD-10-CM | POA: Diagnosis not present

## 2017-03-20 DIAGNOSIS — R269 Unspecified abnormalities of gait and mobility: Secondary | ICD-10-CM | POA: Diagnosis not present

## 2017-03-20 DIAGNOSIS — I119 Hypertensive heart disease without heart failure: Secondary | ICD-10-CM | POA: Diagnosis not present

## 2017-03-20 DIAGNOSIS — L89629 Pressure ulcer of left heel, unspecified stage: Secondary | ICD-10-CM | POA: Diagnosis not present

## 2017-03-21 DIAGNOSIS — L89312 Pressure ulcer of right buttock, stage 2: Secondary | ICD-10-CM | POA: Diagnosis not present

## 2017-03-21 DIAGNOSIS — T8143XD Infection following a procedure, organ and space surgical site, subsequent encounter: Secondary | ICD-10-CM | POA: Diagnosis not present

## 2017-03-24 DIAGNOSIS — T8143XD Infection following a procedure, organ and space surgical site, subsequent encounter: Secondary | ICD-10-CM | POA: Diagnosis not present

## 2017-03-24 DIAGNOSIS — L89312 Pressure ulcer of right buttock, stage 2: Secondary | ICD-10-CM | POA: Diagnosis not present

## 2017-03-24 DIAGNOSIS — N3001 Acute cystitis with hematuria: Secondary | ICD-10-CM | POA: Diagnosis not present

## 2017-03-27 DIAGNOSIS — T8143XD Infection following a procedure, organ and space surgical site, subsequent encounter: Secondary | ICD-10-CM | POA: Diagnosis not present

## 2017-03-27 DIAGNOSIS — L89312 Pressure ulcer of right buttock, stage 2: Secondary | ICD-10-CM | POA: Diagnosis not present

## 2017-03-28 DIAGNOSIS — L89312 Pressure ulcer of right buttock, stage 2: Secondary | ICD-10-CM | POA: Diagnosis not present

## 2017-03-28 DIAGNOSIS — T8143XD Infection following a procedure, organ and space surgical site, subsequent encounter: Secondary | ICD-10-CM | POA: Diagnosis not present

## 2017-03-31 DIAGNOSIS — L89312 Pressure ulcer of right buttock, stage 2: Secondary | ICD-10-CM | POA: Diagnosis not present

## 2017-03-31 DIAGNOSIS — T8143XD Infection following a procedure, organ and space surgical site, subsequent encounter: Secondary | ICD-10-CM | POA: Diagnosis not present

## 2017-04-01 DIAGNOSIS — L89312 Pressure ulcer of right buttock, stage 2: Secondary | ICD-10-CM | POA: Diagnosis not present

## 2017-04-01 DIAGNOSIS — T8143XD Infection following a procedure, organ and space surgical site, subsequent encounter: Secondary | ICD-10-CM | POA: Diagnosis not present

## 2017-04-03 DIAGNOSIS — L89312 Pressure ulcer of right buttock, stage 2: Secondary | ICD-10-CM | POA: Diagnosis not present

## 2017-04-03 DIAGNOSIS — T8143XD Infection following a procedure, organ and space surgical site, subsequent encounter: Secondary | ICD-10-CM | POA: Diagnosis not present

## 2017-04-04 DIAGNOSIS — L89312 Pressure ulcer of right buttock, stage 2: Secondary | ICD-10-CM | POA: Diagnosis not present

## 2017-04-04 DIAGNOSIS — T8143XD Infection following a procedure, organ and space surgical site, subsequent encounter: Secondary | ICD-10-CM | POA: Diagnosis not present

## 2017-04-07 DIAGNOSIS — L89312 Pressure ulcer of right buttock, stage 2: Secondary | ICD-10-CM | POA: Diagnosis not present

## 2017-04-07 DIAGNOSIS — T8143XD Infection following a procedure, organ and space surgical site, subsequent encounter: Secondary | ICD-10-CM | POA: Diagnosis not present

## 2017-04-08 DIAGNOSIS — T8143XD Infection following a procedure, organ and space surgical site, subsequent encounter: Secondary | ICD-10-CM | POA: Diagnosis not present

## 2017-04-08 DIAGNOSIS — L89312 Pressure ulcer of right buttock, stage 2: Secondary | ICD-10-CM | POA: Diagnosis not present

## 2017-04-09 DIAGNOSIS — T8143XD Infection following a procedure, organ and space surgical site, subsequent encounter: Secondary | ICD-10-CM | POA: Diagnosis not present

## 2017-04-09 DIAGNOSIS — L89312 Pressure ulcer of right buttock, stage 2: Secondary | ICD-10-CM | POA: Diagnosis not present

## 2017-04-10 DIAGNOSIS — I480 Paroxysmal atrial fibrillation: Secondary | ICD-10-CM | POA: Diagnosis not present

## 2017-04-10 DIAGNOSIS — N183 Chronic kidney disease, stage 3 (moderate): Secondary | ICD-10-CM | POA: Diagnosis not present

## 2017-04-10 DIAGNOSIS — T8143XD Infection following a procedure, organ and space surgical site, subsequent encounter: Secondary | ICD-10-CM | POA: Diagnosis not present

## 2017-04-10 DIAGNOSIS — R1312 Dysphagia, oropharyngeal phase: Secondary | ICD-10-CM | POA: Diagnosis not present

## 2017-04-10 DIAGNOSIS — F4323 Adjustment disorder with mixed anxiety and depressed mood: Secondary | ICD-10-CM | POA: Diagnosis not present

## 2017-04-10 DIAGNOSIS — E1122 Type 2 diabetes mellitus with diabetic chronic kidney disease: Secondary | ICD-10-CM | POA: Diagnosis not present

## 2017-04-10 DIAGNOSIS — L89312 Pressure ulcer of right buttock, stage 2: Secondary | ICD-10-CM | POA: Diagnosis not present

## 2017-04-10 DIAGNOSIS — R911 Solitary pulmonary nodule: Secondary | ICD-10-CM | POA: Diagnosis not present

## 2017-04-10 DIAGNOSIS — I129 Hypertensive chronic kidney disease with stage 1 through stage 4 chronic kidney disease, or unspecified chronic kidney disease: Secondary | ICD-10-CM | POA: Diagnosis not present

## 2017-04-10 DIAGNOSIS — E441 Mild protein-calorie malnutrition: Secondary | ICD-10-CM | POA: Diagnosis not present

## 2017-04-10 DIAGNOSIS — F0390 Unspecified dementia without behavioral disturbance: Secondary | ICD-10-CM | POA: Diagnosis not present

## 2017-04-10 DIAGNOSIS — F329 Major depressive disorder, single episode, unspecified: Secondary | ICD-10-CM | POA: Diagnosis not present

## 2017-04-11 DIAGNOSIS — L89312 Pressure ulcer of right buttock, stage 2: Secondary | ICD-10-CM | POA: Diagnosis not present

## 2017-04-11 DIAGNOSIS — T8143XD Infection following a procedure, organ and space surgical site, subsequent encounter: Secondary | ICD-10-CM | POA: Diagnosis not present

## 2017-04-15 DIAGNOSIS — T8143XD Infection following a procedure, organ and space surgical site, subsequent encounter: Secondary | ICD-10-CM | POA: Diagnosis not present

## 2017-04-15 DIAGNOSIS — L89312 Pressure ulcer of right buttock, stage 2: Secondary | ICD-10-CM | POA: Diagnosis not present

## 2017-04-17 DIAGNOSIS — R627 Adult failure to thrive: Secondary | ICD-10-CM | POA: Diagnosis not present

## 2017-04-17 DIAGNOSIS — E441 Mild protein-calorie malnutrition: Secondary | ICD-10-CM | POA: Diagnosis not present

## 2017-04-17 DIAGNOSIS — L89152 Pressure ulcer of sacral region, stage 2: Secondary | ICD-10-CM | POA: Diagnosis not present

## 2017-04-17 DIAGNOSIS — K265 Chronic or unspecified duodenal ulcer with perforation: Secondary | ICD-10-CM | POA: Diagnosis not present

## 2017-04-17 DIAGNOSIS — L89619 Pressure ulcer of right heel, unspecified stage: Secondary | ICD-10-CM | POA: Diagnosis not present

## 2017-04-17 DIAGNOSIS — D500 Iron deficiency anemia secondary to blood loss (chronic): Secondary | ICD-10-CM | POA: Diagnosis not present

## 2017-04-17 DIAGNOSIS — R269 Unspecified abnormalities of gait and mobility: Secondary | ICD-10-CM | POA: Diagnosis not present

## 2017-04-17 DIAGNOSIS — F331 Major depressive disorder, recurrent, moderate: Secondary | ICD-10-CM | POA: Diagnosis not present

## 2017-04-17 DIAGNOSIS — L89629 Pressure ulcer of left heel, unspecified stage: Secondary | ICD-10-CM | POA: Diagnosis not present

## 2017-04-17 DIAGNOSIS — I119 Hypertensive heart disease without heart failure: Secondary | ICD-10-CM | POA: Diagnosis not present

## 2017-04-17 DIAGNOSIS — E1142 Type 2 diabetes mellitus with diabetic polyneuropathy: Secondary | ICD-10-CM | POA: Diagnosis not present

## 2017-04-18 DIAGNOSIS — T8143XD Infection following a procedure, organ and space surgical site, subsequent encounter: Secondary | ICD-10-CM | POA: Diagnosis not present

## 2017-04-18 DIAGNOSIS — L89312 Pressure ulcer of right buttock, stage 2: Secondary | ICD-10-CM | POA: Diagnosis not present

## 2017-04-21 DIAGNOSIS — L89312 Pressure ulcer of right buttock, stage 2: Secondary | ICD-10-CM | POA: Diagnosis not present

## 2017-04-21 DIAGNOSIS — T8143XD Infection following a procedure, organ and space surgical site, subsequent encounter: Secondary | ICD-10-CM | POA: Diagnosis not present

## 2017-04-23 DIAGNOSIS — K265 Chronic or unspecified duodenal ulcer with perforation: Secondary | ICD-10-CM | POA: Diagnosis not present

## 2017-04-24 DIAGNOSIS — T8143XD Infection following a procedure, organ and space surgical site, subsequent encounter: Secondary | ICD-10-CM | POA: Diagnosis not present

## 2017-04-24 DIAGNOSIS — L89312 Pressure ulcer of right buttock, stage 2: Secondary | ICD-10-CM | POA: Diagnosis not present

## 2017-04-30 DIAGNOSIS — L89312 Pressure ulcer of right buttock, stage 2: Secondary | ICD-10-CM | POA: Diagnosis not present

## 2017-04-30 DIAGNOSIS — T8143XD Infection following a procedure, organ and space surgical site, subsequent encounter: Secondary | ICD-10-CM | POA: Diagnosis not present

## 2017-05-01 DIAGNOSIS — L89312 Pressure ulcer of right buttock, stage 2: Secondary | ICD-10-CM | POA: Diagnosis not present

## 2017-05-01 DIAGNOSIS — T8143XD Infection following a procedure, organ and space surgical site, subsequent encounter: Secondary | ICD-10-CM | POA: Diagnosis not present

## 2017-05-05 DIAGNOSIS — D500 Iron deficiency anemia secondary to blood loss (chronic): Secondary | ICD-10-CM | POA: Diagnosis not present

## 2017-05-08 DIAGNOSIS — I10 Essential (primary) hypertension: Secondary | ICD-10-CM | POA: Diagnosis not present

## 2017-05-08 DIAGNOSIS — R1310 Dysphagia, unspecified: Secondary | ICD-10-CM | POA: Diagnosis not present

## 2017-05-29 DIAGNOSIS — T8189XD Other complications of procedures, not elsewhere classified, subsequent encounter: Secondary | ICD-10-CM | POA: Diagnosis not present

## 2017-06-18 DIAGNOSIS — R627 Adult failure to thrive: Secondary | ICD-10-CM | POA: Diagnosis not present

## 2017-06-18 DIAGNOSIS — I480 Paroxysmal atrial fibrillation: Secondary | ICD-10-CM | POA: Diagnosis not present

## 2017-06-18 DIAGNOSIS — G3184 Mild cognitive impairment, so stated: Secondary | ICD-10-CM | POA: Diagnosis not present

## 2017-06-18 DIAGNOSIS — F331 Major depressive disorder, recurrent, moderate: Secondary | ICD-10-CM | POA: Diagnosis not present

## 2017-06-18 DIAGNOSIS — I119 Hypertensive heart disease without heart failure: Secondary | ICD-10-CM | POA: Diagnosis not present

## 2017-06-18 DIAGNOSIS — E441 Mild protein-calorie malnutrition: Secondary | ICD-10-CM | POA: Diagnosis not present

## 2017-07-03 DIAGNOSIS — R1310 Dysphagia, unspecified: Secondary | ICD-10-CM | POA: Diagnosis not present

## 2017-07-23 DIAGNOSIS — N179 Acute kidney failure, unspecified: Secondary | ICD-10-CM | POA: Diagnosis not present

## 2017-07-23 DIAGNOSIS — R2243 Localized swelling, mass and lump, lower limb, bilateral: Secondary | ICD-10-CM | POA: Diagnosis not present

## 2017-07-23 DIAGNOSIS — D500 Iron deficiency anemia secondary to blood loss (chronic): Secondary | ICD-10-CM | POA: Diagnosis not present

## 2017-07-23 DIAGNOSIS — E441 Mild protein-calorie malnutrition: Secondary | ICD-10-CM | POA: Diagnosis not present

## 2017-08-21 DIAGNOSIS — E441 Mild protein-calorie malnutrition: Secondary | ICD-10-CM | POA: Diagnosis not present

## 2017-08-21 DIAGNOSIS — E1142 Type 2 diabetes mellitus with diabetic polyneuropathy: Secondary | ICD-10-CM | POA: Diagnosis not present

## 2017-08-21 DIAGNOSIS — D500 Iron deficiency anemia secondary to blood loss (chronic): Secondary | ICD-10-CM | POA: Diagnosis not present

## 2017-08-21 DIAGNOSIS — K265 Chronic or unspecified duodenal ulcer with perforation: Secondary | ICD-10-CM | POA: Diagnosis not present

## 2017-09-08 DIAGNOSIS — S42017A Nondisplaced fracture of sternal end of right clavicle, initial encounter for closed fracture: Secondary | ICD-10-CM | POA: Diagnosis not present

## 2017-09-08 DIAGNOSIS — E441 Mild protein-calorie malnutrition: Secondary | ICD-10-CM | POA: Diagnosis not present

## 2017-09-08 DIAGNOSIS — I119 Hypertensive heart disease without heart failure: Secondary | ICD-10-CM | POA: Diagnosis not present

## 2017-09-11 DIAGNOSIS — M25511 Pain in right shoulder: Secondary | ICD-10-CM | POA: Diagnosis not present

## 2017-09-11 DIAGNOSIS — M898X1 Other specified disorders of bone, shoulder: Secondary | ICD-10-CM | POA: Diagnosis not present

## 2017-11-27 DIAGNOSIS — K265 Chronic or unspecified duodenal ulcer with perforation: Secondary | ICD-10-CM | POA: Diagnosis not present

## 2017-11-27 DIAGNOSIS — Z0000 Encounter for general adult medical examination without abnormal findings: Secondary | ICD-10-CM | POA: Diagnosis not present

## 2017-11-27 DIAGNOSIS — I480 Paroxysmal atrial fibrillation: Secondary | ICD-10-CM | POA: Diagnosis not present

## 2017-11-27 DIAGNOSIS — E1142 Type 2 diabetes mellitus with diabetic polyneuropathy: Secondary | ICD-10-CM | POA: Diagnosis not present

## 2017-11-27 DIAGNOSIS — D500 Iron deficiency anemia secondary to blood loss (chronic): Secondary | ICD-10-CM | POA: Diagnosis not present

## 2017-11-27 DIAGNOSIS — F3341 Major depressive disorder, recurrent, in partial remission: Secondary | ICD-10-CM | POA: Diagnosis not present

## 2017-11-27 DIAGNOSIS — I119 Hypertensive heart disease without heart failure: Secondary | ICD-10-CM | POA: Diagnosis not present

## 2018-01-28 DIAGNOSIS — E1142 Type 2 diabetes mellitus with diabetic polyneuropathy: Secondary | ICD-10-CM | POA: Diagnosis not present

## 2018-01-28 DIAGNOSIS — Z7901 Long term (current) use of anticoagulants: Secondary | ICD-10-CM | POA: Diagnosis not present

## 2018-01-28 DIAGNOSIS — I119 Hypertensive heart disease without heart failure: Secondary | ICD-10-CM | POA: Diagnosis not present

## 2018-01-28 DIAGNOSIS — I480 Paroxysmal atrial fibrillation: Secondary | ICD-10-CM | POA: Diagnosis not present

## 2018-01-28 DIAGNOSIS — N3091 Cystitis, unspecified with hematuria: Secondary | ICD-10-CM | POA: Diagnosis not present

## 2018-01-28 DIAGNOSIS — N3942 Incontinence without sensory awareness: Secondary | ICD-10-CM | POA: Diagnosis not present

## 2018-01-28 DIAGNOSIS — N898 Other specified noninflammatory disorders of vagina: Secondary | ICD-10-CM | POA: Diagnosis not present

## 2018-01-28 DIAGNOSIS — R319 Hematuria, unspecified: Secondary | ICD-10-CM | POA: Diagnosis not present

## 2018-02-23 DIAGNOSIS — N898 Other specified noninflammatory disorders of vagina: Secondary | ICD-10-CM | POA: Diagnosis not present

## 2018-03-17 DIAGNOSIS — N898 Other specified noninflammatory disorders of vagina: Secondary | ICD-10-CM | POA: Diagnosis not present

## 2018-09-18 DIAGNOSIS — E785 Hyperlipidemia, unspecified: Secondary | ICD-10-CM | POA: Diagnosis not present

## 2018-09-18 DIAGNOSIS — I10 Essential (primary) hypertension: Secondary | ICD-10-CM

## 2018-09-18 DIAGNOSIS — F0390 Unspecified dementia without behavioral disturbance: Secondary | ICD-10-CM

## 2018-09-18 DIAGNOSIS — R918 Other nonspecific abnormal finding of lung field: Secondary | ICD-10-CM

## 2018-09-18 DIAGNOSIS — W19XXXA Unspecified fall, initial encounter: Secondary | ICD-10-CM | POA: Diagnosis not present

## 2018-09-18 DIAGNOSIS — S72141A Displaced intertrochanteric fracture of right femur, initial encounter for closed fracture: Secondary | ICD-10-CM

## 2018-09-18 DIAGNOSIS — S72091A Other fracture of head and neck of right femur, initial encounter for closed fracture: Secondary | ICD-10-CM | POA: Diagnosis not present

## 2018-09-18 DIAGNOSIS — D649 Anemia, unspecified: Secondary | ICD-10-CM

## 2018-09-19 DIAGNOSIS — D649 Anemia, unspecified: Secondary | ICD-10-CM | POA: Diagnosis not present

## 2018-09-19 DIAGNOSIS — E785 Hyperlipidemia, unspecified: Secondary | ICD-10-CM | POA: Diagnosis not present

## 2018-09-19 DIAGNOSIS — W19XXXA Unspecified fall, initial encounter: Secondary | ICD-10-CM | POA: Diagnosis not present

## 2018-09-19 DIAGNOSIS — S72091A Other fracture of head and neck of right femur, initial encounter for closed fracture: Secondary | ICD-10-CM | POA: Diagnosis not present

## 2018-09-20 DIAGNOSIS — E785 Hyperlipidemia, unspecified: Secondary | ICD-10-CM | POA: Diagnosis not present

## 2018-09-20 DIAGNOSIS — W19XXXA Unspecified fall, initial encounter: Secondary | ICD-10-CM | POA: Diagnosis not present

## 2018-09-20 DIAGNOSIS — S72091A Other fracture of head and neck of right femur, initial encounter for closed fracture: Secondary | ICD-10-CM | POA: Diagnosis not present

## 2018-09-20 DIAGNOSIS — D649 Anemia, unspecified: Secondary | ICD-10-CM | POA: Diagnosis not present

## 2018-09-21 DIAGNOSIS — W19XXXA Unspecified fall, initial encounter: Secondary | ICD-10-CM | POA: Diagnosis not present

## 2018-09-21 DIAGNOSIS — I480 Paroxysmal atrial fibrillation: Secondary | ICD-10-CM | POA: Diagnosis not present

## 2018-09-21 DIAGNOSIS — E119 Type 2 diabetes mellitus without complications: Secondary | ICD-10-CM

## 2018-09-21 DIAGNOSIS — S72091A Other fracture of head and neck of right femur, initial encounter for closed fracture: Secondary | ICD-10-CM | POA: Diagnosis not present

## 2018-09-22 DIAGNOSIS — S72091A Other fracture of head and neck of right femur, initial encounter for closed fracture: Secondary | ICD-10-CM | POA: Diagnosis not present

## 2018-09-22 DIAGNOSIS — I480 Paroxysmal atrial fibrillation: Secondary | ICD-10-CM | POA: Diagnosis not present

## 2018-09-22 DIAGNOSIS — E119 Type 2 diabetes mellitus without complications: Secondary | ICD-10-CM | POA: Diagnosis not present

## 2018-09-22 DIAGNOSIS — W19XXXA Unspecified fall, initial encounter: Secondary | ICD-10-CM | POA: Diagnosis not present

## 2019-08-05 ENCOUNTER — Ambulatory Visit: Payer: Medicare Other | Admitting: Neurology

## 2019-08-31 ENCOUNTER — Other Ambulatory Visit: Payer: Self-pay

## 2019-08-31 ENCOUNTER — Ambulatory Visit (INDEPENDENT_AMBULATORY_CARE_PROVIDER_SITE_OTHER): Payer: Medicare Other | Admitting: Neurology

## 2019-08-31 ENCOUNTER — Encounter: Payer: Self-pay | Admitting: Neurology

## 2019-08-31 VITALS — BP 132/80 | HR 55 | Ht 64.0 in | Wt 131.3 lb

## 2019-08-31 DIAGNOSIS — F0390 Unspecified dementia without behavioral disturbance: Secondary | ICD-10-CM

## 2019-08-31 DIAGNOSIS — Z818 Family history of other mental and behavioral disorders: Secondary | ICD-10-CM | POA: Diagnosis not present

## 2019-08-31 DIAGNOSIS — R441 Visual hallucinations: Secondary | ICD-10-CM

## 2019-08-31 MED ORDER — MEMANTINE HCL 10 MG PO TABS: 5.0000 mg | ORAL_TABLET | Freq: Two times a day (BID) | ORAL | 3 refills | Status: DC

## 2019-08-31 MED ORDER — MEMANTINE HCL 10 MG PO TABS: 10.0000 mg | ORAL_TABLET | Freq: Two times a day (BID) | ORAL | 3 refills | Status: DC

## 2019-08-31 NOTE — Progress Notes (Signed)
Subjective:    Patient ID: Wanda Gonzalez is a 86 y.o. female.  HPI     Star Age, MD, PhD [facility] [address], [address] [place], [place] [address]  Dear Dr. Selena Batten,   I saw your patient, Wanda Gonzalez, upon your kind request, in my neurologic clinic today for initial consultation of her memory loss.  The patient is accompanied by her daughter today.  As you know, Ms. Nard is an 86 year old right-handed woman with an underlying medical history of hypertension, paroxysmal A. fib, reflux disease, type 2 diabetes, hyperlipidemia, anxiety, and depression, who has had memory loss for the past several years.  She has been on Namenda 5 mg twice daily for about 8 to 10 years per daughter.  She has been able to tolerate this.  In the past several years, at least 2+ years she has had intermittent visual hallucinations.  Her daughter is also worried about her ongoing depression.  Patient has been on sertraline for years.  Her visual hallucinations have been stable.  Most recently in the past few weeks they have improved in fact.  The patient has a family history of dementia, no history of Lewy body dementia in the family or Parkinson's disease.  There is also a family history of mood disorders. The patient has some difficulty providing a concise and detailed history and details are provided by her daughter.  Patient's daughter reports that patient's father had dementia and he attempted suicide at one point.  Her mother had dementia.  Her father had older children and the patient's mother was the second wife.  Patient reports that she had 6 older half siblings and one half sister had dementia.  She had a total of 3 full siblings, and 1 older sister who was her full sibling had dementia, one younger brother with dementia as well.  One of her younger full sisters did not have dementia.  Patient reports that there is anxiety and depression and also suicidal  tendencies in the family.  The patient herself attempted overdose with pills when she lost her husband in 2009 and she was hospitalized in [place].  She is a lifelong non-smoker.  She has no history of excessive alcohol use.  She lives with her second husband.  He has reported to the patient's daughter that patient has a tendency to repeat herself.  She has had intermittent dizziness.  She was supposed to have a brain MRI but has not scheduled it yet.  She reports that her appetite is good, she tries to hydrate well.  She has a history of cataract removals but has not had an eye examination in over 1 year. She has a tendency to sleep during the day.  She may go to bed in the early morning hours and may sleep till the early afternoon.  Her Past Medical History Is Significant For: Past Medical History:  Diagnosis Date   Anxiety    Dementia ([place])    Depression    GAD (generalized anxiety disorder)    Gait disturbance    GERD (gastroesophageal reflux disease)    MCI (mild cognitive impairment)    Paroxysmal A-fib (HCC)    Type 2 diabetes mellitus (HCC)    Visual hallucinations     Her Past Surgical History Is Significant For: Past Surgical History:  Procedure Laterality Date   ABDOMINAL HYSTERECTOMY     ABDOMINAL SACROCOLPOPEXY     COLPORRHAPHY     ESOPHAGEAL DILATION  gastric ulcer     closure   HIP SURGERY     OOPHORECTOMY     SUPRAPUBIC CATHETER INSERTION     TONSILLECTOMY     VAGINAL DELIVERY      Her Family History Is Significant For: Family History  Problem Relation Age of Onset   Hypertension Mother    Thyroid disease Mother    Hypertension Father     Her Social History Is Significant For: Social History   Socioeconomic History   Marital status: Married    Spouse name: Not on file   Number of children: Not on file   Years of education: Not on file   Highest education level: Not on file  Occupational History   Not on file   Tobacco Use   Smoking status: Never Smoker   Smokeless tobacco: Never Used  Substance and Sexual Activity   Alcohol use: Never   Drug use: Never   Sexual activity: Not on file  Other Topics Concern   Not on file  Social History Narrative   Not on file   Social Determinants of Health   Financial Resource Strain:    Difficulty of Paying Living Expenses:   Food Insecurity:    Worried About Charity fundraiser in the Last Year:    Arboriculturist in the Last Year:   Transportation Needs:    Film/video editor (Medical):    Lack of Transportation (Non-Medical):   Physical Activity:    Days of Exercise per Week:    Minutes of Exercise per Session:   Stress:    Feeling of Stress :   Social Connections:    Frequency of Communication with Friends and Family:    Frequency of Social Gatherings with Friends and Family:    Attends Religious Services:    Active Member of Clubs or Organizations:    Attends Archivist Meetings:    Marital Status:     Her Allergies Are:  Allergies  Allergen Reactions   Amoxicillin Itching and Rash  :   Her Current Medications Are:  Outpatient Encounter Medications as of 08/31/2019  Medication Sig   amLODipine (NORVASC) 2.5 MG tablet Take 2.5 mg by mouth daily.   calcium carbonate (OS-CAL) 1250 (500 Ca) MG chewable tablet Chew by mouth.   Cholecalciferol (VITAMIN D3 PO) Take by mouth.   Docusate Sodium (DSS) 100 MG CAPS Take by mouth.   ELIQUIS 5 MG TABS tablet Take 5 mg by mouth 2 (two) times daily.   famotidine (PEPCID) 20 MG tablet 1 pill twice daily for stomach   ferrous sulfate 325 (65 FE) MG tablet Take by mouth.   memantine (NAMENDA) 5 MG tablet TAKE 1 TABLET TWICE A DAY   metFORMIN (GLUCOPHAGE) 500 MG tablet TAKE ONE-HALF (1/2) TABLET DAILY WITH BREAKFAST   metoprolol succinate (TOPROL-XL) 25 MG 24 hr tablet TAKE 1 TABLET DAILY   Multiple Vitamin (MULTIVITAMIN) tablet Take by mouth.    pantoprazole (PROTONIX) 40 MG tablet Take 40 mg by mouth 2 (two) times daily.   polyethylene glycol powder (GLYCOLAX/MIRALAX) 17 GM/SCOOP powder Take by mouth.   sertraline (ZOLOFT) 50 MG tablet Take by mouth.   simvastatin (ZOCOR) 20 MG tablet TAKE 1 TABLET NIGHTLY   No facility-administered encounter medications on file as of 08/31/2019.  :   Review of Systems:  Out of a complete 14 point review of systems, all are reviewed and negative with the exception of these symptoms as listed below:  Review of Systems  Neurological:       Here for consult on memory loss. Last mmse was on 08/20/2019 with PCP score was  27/30.    Objective:  Neurological Exam  Physical Exam Physical Examination:   Vitals:   08/31/19 0956  BP: 132/80  Pulse: (!) 55  SpO2: 97%    General Examination: The patient is a very pleasant 86 y.o. female in no acute distress. She appears well-developed and well-nourished and well groomed.   HEENT: Normocephalic, atraumatic, pupils are equal, round and reactive to light and accommodation, she is status post bilateral cataract repairs, face is symmetric, normal facial animation, no voice tremor, no lip, neck or jaw tremor, hearing is mildly impaired.  She has on airway examination mild mouth dryness, tongue protrudes centrally and palate elevates symmetrically.  No carotid bruits.  She does not have any active hallucinations.  Chest: Clear to auscultation without wheezing, rhonchi or crackles noted.  Heart: S1+S2+0, regular and normal without murmurs, rubs or gallops noted.   Abdomen: Soft, non-tender and non-distended with normal bowel sounds appreciated on auscultation.  Extremities: There is no pitting edema in the distal lower extremities bilaterally.   Skin: Warm and dry without trophic changes noted.  Musculoskeletal: exam reveals some arthritic changes in her hands.   Neurologically:  Mental status: The patient is awake, pays good attention, she is  cooperative with the exam and provides her history fairly well with appropriate answers, but is unable to give any details. There is no evidence of aphasia, agnosia, apraxia or anomia. Speech is clear with normal prosody and enunciation. Thought process is linear. Mood is normal and affect is blunted.  Cranial nerves II - XII are as described above under HEENT exam. In addition: shoulder shrug is normal with equal shoulder height noted. Motor exam: Normal bulk, strength and tone is noted. There is no drift, tremor or rebound. Romberg is not tested due to safety concerns.  Fine motor skills are globally mildly impaired, no resting tremor is noted.  No obvious lateralization of her mild fine motor dyscontrol.   Sensory exam is intact to light touch, no cerebellar signs, no ataxia noted.  She stands with mild difficulty, she has a 4 pronged cane which she holds on the left side.  She has no shuffling.  She has preserved arm swing on the right.    Assessment and Plan:   In summary, Jacqueleen Werman is a very pleasant 86 y.o.-year old female with an underlying medical history of hypertension, paroxysmal A. fib, reflux disease, type 2 diabetes, hyperlipidemia, anxiety, and depression, who presents for evaluation of her memory loss.  She has multiple vascular risk factors.  She also has a family history of dementia, which may increase her risk for Alzheimer's dementia.  I had a long discussion with the patient and her daughter.  I think proceeding with a brain MRI would be helpful as it may show vascular changes versus changes that are typically seen in Alzheimer's dementia.  A confounding factor is her underlying mood disorder.  The patient denies any significant degree of depression but the daughter is worried that there may still be a component of untreated or suboptimally treated depression.  I suggested a referral to geriatric psychiatry but the patient declined.  She has been on Namenda for many years.   She has had hallucinations in the past few years which are fairly stable and not disruptive to her life.  I doubt that she has Lewy  body dementia given the longer course of her memory loss and more recent onset of her hallucinations which are stable or even improved clinically.  I suggested she increase her Namenda to 10 mg twice daily.  We talked about the importance of healthy lifestyle, physical activity, good nutrition, good hydration with water.  She is advised to follow-up routinely to see one of our nurse practitioners in 3 to 4 months and follow-up with you as scheduled.  She is advised to schedule an eye appointment for a formal eye examination as she has not had 1 in over a year.  I answered all her questions today and the patient and her daughter were in agreement with the plan.   Thank you very much for allowing me to participate in the care of this nice patient. If I can be of any further assistance to you please do not hesitate to call me at [phone].  Sincerely,   Star Age, MD, PhD

## 2019-08-31 NOTE — Patient Instructions (Signed)
I agree with Dr. Selena Batten that a brain MRI may be helpful.  You have a family history of memory loss and dementia.  I am not convinced that you have Lewy body dementia and would favor the diagnosis of Alzheimer's dementia.  Your hallucinations have been stable thankfully.  I would recommend an updated eye examination especially since you have not seen an eye doctor in over 1 year.  As discussed, we will increase your Namenda to 10 mg strength take 1 pill twice daily.  You can continue your current prescription until the new prescription is filled and comes through the mail.    Evaluation with a geriatric psychiatrist may be helpful as well.  If you change your mind you can always call us for a referral to psychiatry.  Please also talk to Dr. Selena Batten about it.

## 2019-09-09 ENCOUNTER — Ambulatory Visit: Payer: Medicare Other | Admitting: Neurology

## 2019-09-17 ENCOUNTER — Telehealth: Payer: Self-pay | Admitting: Neurology

## 2019-09-17 NOTE — Telephone Encounter (Signed)
ROUTH,DEDRA(daughter on DPR) is asking for a call from RN to discuss the difficulty she is having in getting pt's memantine (NAMENDA) 10 MG tablet from  [facility]

## 2019-09-20 NOTE — Telephone Encounter (Signed)
Pt's daughter called again and left a VM asking for a call back to discuss medication that the pt has not received yet.

## 2019-09-20 NOTE — Telephone Encounter (Signed)
I called [facility] and spoke with Lana. She sts they received confirmation from the [place] postal service on 09/06/2019 that the memantine 10 mg bid had been delivered.  Lana provided me with # and tracking number for the pt's daughter to call. Tracking # is [id number] and phone # [phone].   Pt's daughter advised of this information and verbalized understanding.

## 2019-11-08 ENCOUNTER — Telehealth: Payer: Self-pay | Admitting: Neurology

## 2019-11-08 MED ORDER — MEMANTINE HCL 10 MG PO TABS: 10.0000 mg | ORAL_TABLET | Freq: Two times a day (BID) | ORAL | 0 refills | Status: DC

## 2019-11-08 NOTE — Telephone Encounter (Signed)
Refill submitted. 

## 2019-11-08 NOTE — Addendum Note (Signed)
Addended by: Verlin Grills on: 11/08/2019 11:18 AM   Modules accepted: Orders

## 2019-11-08 NOTE — Telephone Encounter (Signed)
Pt is requesting a refill for memantine (NAMENDA) 10 MG tablet.  Pharmacy: [place]

## 2020-01-03 ENCOUNTER — Ambulatory Visit (INDEPENDENT_AMBULATORY_CARE_PROVIDER_SITE_OTHER): Payer: Medicare Other | Admitting: Family Medicine

## 2020-01-03 ENCOUNTER — Encounter: Payer: Self-pay | Admitting: Family Medicine

## 2020-01-03 ENCOUNTER — Other Ambulatory Visit: Payer: Self-pay

## 2020-01-03 VITALS — BP 118/62 | HR 81 | Ht 61.0 in | Wt 120.0 lb

## 2020-01-03 DIAGNOSIS — R441 Visual hallucinations: Secondary | ICD-10-CM | POA: Diagnosis not present

## 2020-01-03 DIAGNOSIS — F0390 Unspecified dementia without behavioral disturbance: Secondary | ICD-10-CM

## 2020-01-03 MED ORDER — MEMANTINE HCL 10 MG PO TABS: 10.0000 mg | ORAL_TABLET | Freq: Two times a day (BID) | ORAL | 3 refills | Status: DC

## 2020-01-03 NOTE — Patient Instructions (Addendum)
We will continue Namenda 10mg  twice daily   I am so sorry to hear about your daughter. I know that has been difficult. Please let us know if you need anything at all. I will let Dr Rexene Alberts know.   Stay well hydrated. Eat regular meals and try to stay active.   We will plan to follow up in 6 months   Complicated Grief Grief is a normal response to the death of someone close to you. Feelings of fear, anger, and guilt can affect almost everyone who loses a loved one. It is also common to have symptoms of depression while you are grieving. These include problems with sleep, loss of appetite, and lack of energy. They may last for weeks or months after a loss. Complicated grief is different from normal grief or depression. Normal grieving involves sadness and feelings of loss, but those feelings get better and heal over time. Complicated grief is a severe type of grief that lasts for a long time, usually for several months to a year or longer. It interferes with your ability to function normally. Complicated grief may require treatment from a mental health care provider. What are the causes? The cause of this condition is not known. It is not clear why some people continue to struggle with grief and others do not. What increases the risk? You are more likely to develop this condition if:  The death of your loved one was sudden or unexpected.  The death of your loved one was due to a violent event.  Your loved one died from suicide.  Your loved one was a child or a young person.  You were very close to your loved one, or you were dependent on him or her.  You have a history of depression or anxiety. What are the signs or symptoms? Symptoms of this condition include:  Feeling disbelief or having a lack of emotion (numbness).  Being unable to enjoy good memories of your loved one.  Needing to avoid anything or anyone that reminds you of your loved one.  Being unable to stop thinking about  the death.  Feeling intense anger or guilt.  Feeling alone and hopeless.  Feeling that your life is meaningless and empty.  Losing the desire to move on with your life. How is this diagnosed? This condition may be diagnosed based on:  Your symptoms. Complicated grief will be diagnosed if you have ongoing symptoms of grief for 6-12 months or longer.  The effect of symptoms on your life. You may be diagnosed with this condition if your symptoms are interfering with your ability to live your life. Your health care provider may recommend that you see a mental health care provider. Many symptoms of depression are similar to the symptoms of complicated grief. It is important to be evaluated for complicated grief along with other mental health conditions. How is this treated? This condition is most commonly treated with talk therapy. This therapy is offered by a mental health specialist (psychiatrist). During therapy:  You will learn healthy ways to cope with the loss of your loved one.  Your mental health care provider may recommend antidepressant medicines. Follow these instructions at home: Lifestyle   Take care of yourself. ? Eat on a regular basis, and maintain a healthy diet. Eat plenty of fruits, vegetables, lean protein, and whole grains. ? Try to get some exercise each day. Aim for 30 minutes of exercise on most days of the week. ? Keep a consistent  sleep schedule. Try to get 8 or more hours of sleep each night. ? Start doing the things that you used to enjoy.  Do not use drugs or alcohol to ease your symptoms.  Spend time with friends and loved ones. General instructions  Take over-the-counter and prescription medicines only as told by your health care provider.  Consider joining a grief (bereavement) support group to help you deal with your loss.  Keep all follow-up visits as told by your health care provider. This is important. Contact a health care provider if:  Your  symptoms prevent you from functioning normally.  Your symptoms do not get better with treatment. Get help right away if:  You have serious thoughts about hurting yourself or someone else.  You have suicidal feelings. If you ever feel like you may hurt yourself or others, or have thoughts about taking your own life, get help right away. You can go to your nearest emergency department or call:  Your local emergency services (911 in the [place]  A suicide crisis helpline, such as the [facility] at 8636247595. This is open 24 hours a day. Summary  Complicated grief is a severe type of grief that lasts for a long time. This grief is not likely to go away on its own. Get the help you need.  Some griefs are more difficult than others and can cause this condition. You may need a certain type of treatment to help you recover if the loss of your loved one was sudden, violent, or due to suicide.  You may feel guilty about moving on with your life. Getting help does not mean that you are forgetting your loved one. It means that you are taking care of yourself.  Complicated grief is best treated with talk therapy. Medicines may also be prescribed.  Seek the help you need, and find support that will help you recover. This information is not intended to replace advice given to you by your health care provider. Make sure you discuss any questions you have with your health care provider. Document Revised: 02/28/2017 Document Reviewed: 01/01/2017 Elsevier Patient Education  [employer]  9. Use "WARM" strategy.  W= write it down  A= associate it  R= repeat it  M= make a mental note  2.   You can keep a Social worker.  Use a 3-ring notebook with sections for the following: calendar, important names and phone numbers,  medications, doctors' names/phone numbers, lists/reminders, and a section to journal what you did  each  day.   3.    Use a calendar to write appointments down.  4.    Write yourself a schedule for the day.  This can be placed on the calendar or in a separate section of the Memory Notebook.  Keeping a  regular schedule can help memory.  5.    Use medication organizer with sections for each day or morning/evening pills.  You may need help loading it  6.    Keep a basket, or pegboard by the door.  Place items that you need to take out with you in the basket or on the pegboard.  You may also want to  include a message board for reminders.  7.    Use sticky notes.  Place sticky notes with reminders in a place where the task is performed.  For example: " turn off the  stove" placed by the stove, "lock the door"  placed on the door at eye level, " take your medications" on  the bathroom mirror or by the place where you normally take your medications.  8.    Use alarms/timers.  Use while cooking to remind yourself to check on food or as a reminder to take your medicine, or as a  reminder to make a call, or as a reminder to perform another task, etc.

## 2020-01-03 NOTE — Progress Notes (Addendum)
PATIENT: Wanda Gonzalez DOB: 1931/04/01  REASON FOR VISIT: follow up HISTORY FROM: patient  Chief Complaint  Patient presents with  . Follow-up     HISTORY OF PRESENT ILLNESS: Today 01/03/20 Wanda Gonzalez is a 86 y.o. female here today for follow up for memory loss. She presents today with her son, Wanda Gonzalez, who aids in history. She did increase memantine to 10mg  BID. She has tolerated dose increase. Son reports that hallucinations have improved. Memory seems to be about the same. Unfortunately, she lost her daughter two weeks ago to an aortic dissection. She feels that she is holding up fairly well. She denies any significant depression. She is eating and drinking normally. No falls. She lives with her husband. She is able to perform ADL's independently. She does not drive.    HISTORY: (copied from Dr Guadelupe Sabin note on 08/31/2019)  Dear Dr. Selena Batten,   I saw your patient, Wanda Gonzalez, upon your kind request, in my neurologic clinic today for initial consultation of her memory loss.  The patient is accompanied by her daughter today.  As you know, Ms. Wanda Gonzalez is an 86 year old right-handed woman with an underlying medical history of hypertension, paroxysmal A. fib, reflux disease, type 2 diabetes, hyperlipidemia, anxiety, and depression, who has had memory loss for the past several years.  She has been on Namenda 5 mg twice daily for about 8 to 10 years per daughter.  She has been able to tolerate this.  In the past several years, at least 2+ years she has had intermittent visual hallucinations.  Her daughter is also worried about her ongoing depression.  Patient has been on sertraline for years.  Her visual hallucinations have been stable.  Most recently in the past few weeks they have improved in fact.  The patient has a family history of dementia, no history of Lewy body dementia in the family or Parkinson's disease.  There is also a family history of mood disorders. The patient  has some difficulty providing a concise and detailed history and details are provided by her daughter.  Patient's daughter reports that patient's father had dementia and he attempted suicide at one point.  Her mother had dementia.  Her father had older children and the patient's mother was the second wife.  Patient reports that she had 6 older half siblings and one half sister had dementia.  She had a total of 3 full siblings, and 1 older sister who was her full sibling had dementia, one younger brother with dementia as well.  One of her younger full sisters did not have dementia.  Patient reports that there is anxiety and depression and also suicidal tendencies in the family.  The patient herself attempted overdose with pills when she lost her husband in 2009 and she was hospitalized in [facility].  She is a lifelong non-smoker.  She has no history of excessive alcohol use.  She lives with her second husband.  He has reported to the patient's daughter that patient has a tendency to repeat herself.  She has had intermittent dizziness.  She was supposed to have a brain MRI but has not scheduled it yet.  She reports that her appetite is good, she tries to hydrate well.  She has a history of cataract removals but has not had an eye examination in over 1 year. She has a tendency to sleep during the day.  She may go to bed in the early morning hours and may sleep till the early afternoon.  REVIEW OF SYSTEMS: Out of a complete 14 system review of symptoms, the patient complains only of the following symptoms, memory loss, depression and all other reviewed systems are negative.   ALLERGIES: Allergies  Allergen Reactions  . Amoxicillin Itching and Rash    HOME MEDICATIONS: Outpatient Medications Prior to Visit  Medication Sig Dispense Refill  . amLODipine (NORVASC) 2.5 MG tablet Take 2.5 mg by mouth daily.    . calcium carbonate (OS-CAL) 1250 (500 Ca) MG chewable tablet Chew by mouth.    .  Cholecalciferol (VITAMIN D3 PO) Take by mouth.    Wanda Gonzalez Sodium (DSS) 100 MG CAPS Take by mouth.    Wanda Gonzalez 5 MG TABS tablet Take 5 mg by mouth 2 (two) times daily.    . famotidine (PEPCID) 20 MG tablet 1 pill twice daily for stomach    . ferrous sulfate 325 (65 FE) MG tablet Take by mouth.    . metFORMIN (GLUCOPHAGE) 500 MG tablet TAKE ONE-HALF (1/2) TABLET DAILY WITH BREAKFAST    . metoprolol succinate (TOPROL-XL) 25 MG 24 hr tablet TAKE 1 TABLET DAILY    . Multiple Vitamin (MULTIVITAMIN) tablet Take by mouth.    . pantoprazole (PROTONIX) 40 MG tablet Take 40 mg by mouth 2 (two) times daily.    . polyethylene glycol powder (GLYCOLAX/MIRALAX) 17 GM/SCOOP powder Take by mouth.    . sertraline (ZOLOFT) 50 MG tablet Take by mouth.    . simvastatin (ZOCOR) 20 MG tablet TAKE 1 TABLET NIGHTLY    . memantine (NAMENDA) 10 MG tablet Take 1 tablet (10 mg total) by mouth 2 (two) times daily. 180 tablet 0   No facility-administered medications prior to visit.    PAST MEDICAL HISTORY: Past Medical History:  Diagnosis Date  . Anxiety   . Dementia ([place])   . Depression   . GAD (generalized anxiety disorder)   . Gait disturbance   . GERD (gastroesophageal reflux disease)   . MCI (mild cognitive impairment)   . Paroxysmal A-fib ([place])   . Type 2 diabetes mellitus ([place])   . Visual hallucinations     PAST SURGICAL HISTORY: Past Surgical History:  Procedure Laterality Date  . ABDOMINAL HYSTERECTOMY    . ABDOMINAL SACROCOLPOPEXY    . COLPORRHAPHY    . ESOPHAGEAL DILATION    . gastric ulcer     closure  . HIP SURGERY    . OOPHORECTOMY    . SUPRAPUBIC CATHETER INSERTION    . TONSILLECTOMY    . VAGINAL DELIVERY      FAMILY HISTORY: Family History  Problem Relation Age of Onset  . Hypertension Mother   . Thyroid disease Mother   . Hypertension Father     SOCIAL HISTORY: Social History   Socioeconomic History  . Marital status: Married    Spouse name: Not on file  . Number of  children: Not on file  . Years of education: Not on file  . Highest education level: Not on file  Occupational History  . Not on file  Tobacco Use  . Smoking status: Never Smoker  . Smokeless tobacco: Never Used  Substance and Sexual Activity  . Alcohol use: Never  . Drug use: Never  . Sexual activity: Not on file  Other Topics Concern  . Not on file  Social History Narrative  . Not on file   Social Determinants of Health   Financial Resource Strain:   . Difficulty of Paying Living Expenses: Not on file  Food  Insecurity:   . Worried About Charity fundraiser in the Last Year: Not on file  . Ran Out of Food in the Last Year: Not on file  Transportation Needs:   . Lack of Transportation (Medical): Not on file  . Lack of Transportation (Non-Medical): Not on file  Physical Activity:   . Days of Exercise per Week: Not on file  . Minutes of Exercise per Session: Not on file  Stress:   . Feeling of Stress : Not on file  Social Connections:   . Frequency of Communication with Friends and Family: Not on file  . Frequency of Social Gatherings with Friends and Family: Not on file  . Attends Religious Services: Not on file  . Active Member of Clubs or Organizations: Not on file  . Attends Archivist Meetings: Not on file  . Marital Status: Not on file  Intimate Partner Violence:   . Fear of Current or Ex-Partner: Not on file  . Emotionally Abused: Not on file  . Physically Abused: Not on file  . Sexually Abused: Not on file      PHYSICAL EXAM  Vitals:   01/03/20 1440  BP: 118/62  Pulse: 81  Weight: 120 lb (54.4 kg)  Height: 5\' 1"  (1.549 m)   Body mass index is 22.67 kg/m.  Generalized: Well developed, in no acute distress  Cardiology: normal rate and rhythm, no murmur noted Respiratory: clear to auscultation bilaterally  Neurological examination  Mentation: Alert oriented to time, place, history taking. Follows all commands speech and language  fluent Cranial nerve II-XII: Pupils were equal round reactive to light. Extraocular movements were full, visual field were full on confrontational test. Facial sensation and strength were normal. Head turning and shoulder shrug  were normal and symmetric. Motor: The motor testing reveals 5 over 5 strength of all 4 extremities. Good symmetric motor tone is noted throughout.  Gait and station: Gait is short but stable with four prong cane   DIAGNOSTIC DATA (LABS, IMAGING, TESTING) - I reviewed patient records, labs, notes, testing and imaging myself where available.  No flowsheet data found.   No results found for: WBC, HGB, HCT, MCV, PLT    Component Value Date/Time   NA 138 02/27/2007 1132   K 4.4 02/27/2007 1132   CL 104 02/27/2007 1132   CO2 28 02/27/2007 1132   GLUCOSE 143 (H) 02/27/2007 1132   BUN 15 02/27/2007 1132   CREATININE 1.3 (H) 02/27/2007 1132   CALCIUM 10.4 02/27/2007 1132   GFRNONAA 43 02/27/2007 1132   GFRAA 51 02/27/2007 1132   No results found for: CHOL, HDL, LDLCALC, LDLDIRECT, TRIG, CHOLHDL No results found for: HGBA1C No results found for: VITAMINB12 No results found for: TSH     ASSESSMENT AND PLAN 86 y.o. year old female  has a past medical history of Anxiety, Dementia ([place]), Depression, GAD (generalized anxiety disorder), Gait disturbance, GERD (gastroesophageal reflux disease), MCI (mild cognitive impairment), Paroxysmal A-fib ([place]), Type 2 diabetes mellitus ([place]), and Visual hallucinations. here with     ICD-10-CM   1. Dementia without behavioral disturbance, unspecified dementia type ([place])  F03.90   2. Visual hallucination  R44.1     Wanda Gonzalez is doing fairly well, today. She has tolerated increased dose of memantine. We will continue 10mg  BID. She lost her daughter, unexpectedly, 2 weeks ago. We have discussed this in detail. She is doing quite well, considering. Memory is intact today. She refuses MMSE as she is afraid  she would not score as  well. We have discussed normal stages of grief and concerns that I want her to monitor for at home. She will continue to be active around the home. Healthy lifestyle habits encouraged. She will follow up with [place] in 6 months. She requests that her appts be made with her husband who is also a patient of ours.    No orders of the defined types were placed in this encounter.    Meds ordered this encounter  Medications  . memantine (NAMENDA) 10 MG tablet    Sig: Take 1 tablet (10 mg total) by mouth 2 (two) times daily.    Dispense:  180 tablet    Refill:  3    Order Specific Question:   Supervising Provider    Answer:   Melvenia Beam [id number]      I spent 30 minutes with the patient. 50% of this time was spent counseling and educating patient on plan of care and medications.     Debbora Presto, FNP-C 01/03/2020, 4:22 PM [facility] [address], [place], [place] [address] [phone]  I reviewed the above note and documentation by the Nurse Practitioner and agree with the history, exam, assessment and plan as outlined above. I was available for consultation. Star Age, MD, PhD [facility])

## 2020-05-16 ENCOUNTER — Encounter: Payer: Self-pay | Admitting: Family Medicine

## 2020-05-16 ENCOUNTER — Other Ambulatory Visit: Payer: Self-pay

## 2020-05-16 ENCOUNTER — Ambulatory Visit (INDEPENDENT_AMBULATORY_CARE_PROVIDER_SITE_OTHER): Payer: Medicare Other | Admitting: Family Medicine

## 2020-05-16 VITALS — BP 128/72 | HR 57 | Ht 64.0 in | Wt 104.0 lb

## 2020-05-16 DIAGNOSIS — F0390 Unspecified dementia without behavioral disturbance: Secondary | ICD-10-CM | POA: Diagnosis not present

## 2020-05-16 DIAGNOSIS — R441 Visual hallucinations: Secondary | ICD-10-CM | POA: Diagnosis not present

## 2020-05-16 NOTE — Patient Instructions (Signed)
Below is our plan:  We will continue memantine 10mg  twice daily. We will discuss donepezil with Dr Rexene Alberts and your PCP. I want to make sure you do not have any significant heart disease before deciding about starting it. It can cause an abnormal heart rhythm. Most common side effects are gastric upset or abnormal dreams. I have included information below to review.  Please make sure you are staying well hydrated. I recommend 50-60 ounces daily. Well balanced diet and regular exercise encouraged. Consistent sleep schedule with 6-8 hours recommended.   Please continue follow up with care team as directed.   Follow up with me in 6 months  You may receive a survey regarding today's visit. I encourage you to leave honest feed back as I do use this information to improve patient care. Thank you for seeing me today!      Memory Compensation Strategies  1. Use "WARM" strategy.  W= write it down  A= associate it  R= repeat it  M= make a mental note  2.   You can keep a Social worker.  Use a 3-ring notebook with sections for the following: calendar, important names and phone numbers,  medications, doctors' names/phone numbers, lists/reminders, and a section to journal what you did  each day.   3.    Use a calendar to write appointments down.  4.    Write yourself a schedule for the day.  This can be placed on the calendar or in a separate section of the Memory Notebook.  Keeping a  regular schedule can help memory.  5.    Use medication organizer with sections for each day or morning/evening pills.  You may need help loading it  6.    Keep a basket, or pegboard by the door.  Place items that you need to take out with you in the basket or on the pegboard.  You may also want to  include a message board for reminders.  7.    Use sticky notes.  Place sticky notes with reminders in a place where the task is performed.  For example: " turn off the  stove" placed by the stove, "lock the door"  placed on the door at eye level, " take your medications" on  the bathroom mirror or by the place where you normally take your medications.  8.    Use alarms/timers.  Use while cooking to remind yourself to check on food or as a reminder to take your medicine, or as a  reminder to make a call, or as a reminder to perform another task, etc.    Donepezil tablets What is this medicine? DONEPEZIL (doe NEP e zil) is used to treat mild to moderate dementia caused by Alzheimer's disease. This medicine may be used for other purposes; ask your health care provider or pharmacist if you have questions. COMMON BRAND NAME(S): Aricept What should I tell my health care provider before I take this medicine? They need to know if you have any of these conditions:  asthma or other lung disease  difficulty passing urine  head injury  heart disease  history of irregular heartbeat  liver disease  seizures (convulsions)  stomach or intestinal disease, ulcers or stomach bleeding  an unusual or allergic reaction to donepezil, other medicines, foods, dyes, or preservatives  pregnant or trying to get pregnant  breast-feeding How should I use this medicine? Take this medicine by mouth with a glass of water. Follow the directions on the  prescription label. You may take this medicine with or without food. Take this medicine at regular intervals. This medicine is usually taken before bedtime. Do not take it more often than directed. Continue to take your medicine even if you feel better. Do not stop taking except on your doctor's advice. If you are taking the 23 mg donepezil tablet, swallow it whole; do not cut, crush, or chew it. Talk to your pediatrician regarding the use of this medicine in children. Special care may be needed. Overdosage: If you think you have taken too much of this medicine contact a poison control center or emergency room at once. NOTE: This medicine is only for you. Do not share  this medicine with others. What if I miss a dose? If you miss a dose, take it as soon as you can. If it is almost time for your next dose, take only that dose, do not take double or extra doses. What may interact with this medicine? Do not take this medicine with any of the following medications:  certain medicines for fungal infections like itraconazole, fluconazole, posaconazole, and voriconazole  cisapride  dextromethorphan; quinidine  dronedarone  pimozide  quinidine  thioridazine This medicine may also interact with the following medications:  antihistamines for allergy, cough and cold  atropine  bethanechol  carbamazepine  certain medicines for bladder problems like oxybutynin, tolterodine  certain medicines for Parkinson's disease like benztropine, trihexyphenidyl  certain medicines for stomach problems like dicyclomine, hyoscyamine  certain medicines for travel sickness like scopolamine  dexamethasone  dofetilide  ipratropium  NSAIDs, medicines for pain and inflammation, like ibuprofen or naproxen  other medicines for Alzheimer's disease  other medicines that prolong the QT interval (cause an abnormal heart rhythm)  phenobarbital  phenytoin  rifampin, rifabutin or rifapentine  ziprasidone This list may not describe all possible interactions. Give your health care provider a list of all the medicines, herbs, non-prescription drugs, or dietary supplements you use. Also tell them if you smoke, drink alcohol, or use illegal drugs. Some items may interact with your medicine. What should I watch for while using this medicine? Visit your doctor or health care professional for regular checks on your progress. Check with your doctor or health care professional if your symptoms do not get better or if they get worse. You may get drowsy or dizzy. Do not drive, use machinery, or do anything that needs mental alertness until you know how this drug affects  you. What side effects may I notice from receiving this medicine? Side effects that you should report to your doctor or health care professional as soon as possible:  allergic reactions like skin rash, itching or hives, swelling of the face, lips, or tongue  feeling faint or lightheaded, falls  loss of bladder control  seizures  signs and symptoms of a dangerous change in heartbeat or heart rhythm like chest pain; dizziness; fast or irregular heartbeat; palpitations; feeling faint or lightheaded, falls; breathing problems  signs and symptoms of infection like fever or chills; cough; sore throat; pain or trouble passing urine  signs and symptoms of liver injury like dark yellow or brown urine; general ill feeling or flu-like symptoms; light-colored stools; loss of appetite; nausea; right upper belly pain; unusually weak or tired; yellowing of the eyes or skin  slow heartbeat or palpitations  unusual bleeding or bruising  vomiting Side effects that usually do not require medical attention (report to your doctor or health care professional if they continue or are bothersome):  diarrhea, especially when starting treatment  headache  loss of appetite  muscle cramps  nausea  stomach upset This list may not describe all possible side effects. Call your doctor for medical advice about side effects. You may report side effects to FDA at 1-800-FDA-1088. Where should I keep my medicine? Keep out of reach of children. Store at room temperature between 15 and 30 degrees C (59 and 86 degrees F). Throw away any unused medicine after the expiration date. NOTE: This sheet is a summary. It may not cover all possible information. If you have questions about this medicine, talk to your doctor, pharmacist, or health care provider.  2021 Elsevier/Gold Standard (2018-03-09 10:33:41)

## 2020-05-16 NOTE — Progress Notes (Addendum)
PATIENT: Wanda Gonzalez DOB: 08-19-1930  REASON FOR VISIT: follow up HISTORY FROM: patient  Chief Complaint  Patient presents with  . Follow-up    Rm 1 with son Wanda Gonzalez) Pt is well, been having hallucinations at night per son     HISTORY OF PRESENT ILLNESS:  05/16/20 ALL:  She returns for memory follow up. She presents, today, with her son Wanda Gonzalez) who aids in history. She continues memantine 10mg  BID. Memory is stable. She has waxing and waning hallucinations. She is not bothered by these events. She continues t live with her husband who assists in medication administration. She is able to perform ADL's independently. No falls. Appetite is good. She is off with sleep schedule. Stays up late and sleeps in late. Mood is good. She has a history of HTN and atrial fibrillation on Eliquis. No CAD documented. No stroke history. She does not remember if she has taken donepezil in the past.    01/03/2020 ALL:  Wanda Gonzalez is a 87 y.o. female here today for follow up for memory loss. She presents today with her son, Wanda Gonzalez, who aids in history. She did increase memantine to 10mg  BID. She has tolerated dose increase. Son reports that hallucinations have improved. Memory seems to be about the same. Unfortunately, she lost her daughter two weeks ago to an aortic dissection. She feels that she is holding up fairly well. She denies any significant depression. She is eating and drinking normally. No falls. She lives with her husband. She is able to perform ADL's independently. She does not drive.    HISTORY: (copied from Dr Guadelupe Sabin note on 08/31/2019)  Dear Dr. Selena Batten,   I saw your patient, Wanda Gonzalez, upon your kind request, in my neurologic clinic today for initial consultation of her memory loss.  The patient is accompanied by her daughter today.  As you know, Wanda Gonzalez is an 87 year old right-handed woman with an underlying medical history of hypertension, paroxysmal A. fib,  reflux disease, type 2 diabetes, hyperlipidemia, anxiety, and depression, who has had memory loss for the past several years.  She has been on Namenda 5 mg twice daily for about 8 to 10 years per daughter.  She has been able to tolerate this.  In the past several years, at least 2+ years she has had intermittent visual hallucinations.  Her daughter is also worried about her ongoing depression.  Patient has been on sertraline for years.  Her visual hallucinations have been stable.  Most recently in the past few weeks they have improved in fact.  The patient has a family history of dementia, no history of Lewy body dementia in the family or Parkinson's disease.  There is also a family history of mood disorders. The patient has some difficulty providing a concise and detailed history and details are provided by her daughter.  Patient's daughter reports that patient's father had dementia and he attempted suicide at one point.  Her mother had dementia.  Her father had older children and the patient's mother was the second wife.  Patient reports that she had 6 older half siblings and one half sister had dementia.  She had a total of 3 full siblings, and 1 older sister who was her full sibling had dementia, one younger brother with dementia as well.  One of her younger full sisters did not have dementia.  Patient reports that there is anxiety and depression and also suicidal tendencies in the family.  The patient herself attempted overdose with  pills when she lost her husband in 2009 and she was hospitalized in [place].  She is a lifelong non-smoker.  She has no history of excessive alcohol use.  She lives with her second husband.  He has reported to the patient's daughter that patient has a tendency to repeat herself.  She has had intermittent dizziness.  She was supposed to have a brain MRI but has not scheduled it yet.  She reports that her appetite is good, she tries to hydrate well.  She has a history of cataract  removals but has not had an eye examination in over 1 year. She has a tendency to sleep during the day.  She may go to bed in the early morning hours and may sleep till the early afternoon.   REVIEW OF SYSTEMS: Out of a complete 14 system review of symptoms, the patient complains only of the following symptoms, memory loss, depression and all other reviewed systems are negative.   ALLERGIES: Allergies  Allergen Reactions  . Amoxicillin Itching and Rash    HOME MEDICATIONS: Outpatient Medications Prior to Visit  Medication Sig Dispense Refill  . amLODipine (NORVASC) 2.5 MG tablet Take 2.5 mg by mouth daily.    . calcium carbonate (OS-CAL) 1250 (500 Ca) MG chewable tablet Chew by mouth.    . Cholecalciferol (VITAMIN D3 PO) Take by mouth.    Wanda Gonzalez Sodium (DSS) 100 MG CAPS Take by mouth.    [name] 5 MG TABS tablet Take 5 mg by mouth 2 (two) times daily.    . famotidine (PEPCID) 20 MG tablet 1 pill twice daily for stomach    . ferrous sulfate 325 (65 FE) MG tablet Take by mouth.    . memantine (NAMENDA) 10 MG tablet Take 1 tablet (10 mg total) by mouth 2 (two) times daily. 180 tablet 3  . metFORMIN (GLUCOPHAGE) 500 MG tablet TAKE ONE-HALF (1/2) TABLET DAILY WITH BREAKFAST    . metoprolol succinate (TOPROL-XL) 25 MG 24 hr tablet TAKE 1 TABLET DAILY    . Multiple Vitamin (MULTIVITAMIN) tablet Take by mouth.    . pantoprazole (PROTONIX) 40 MG tablet Take 40 mg by mouth 2 (two) times daily.    . polyethylene glycol powder (GLYCOLAX/MIRALAX) 17 GM/SCOOP powder Take by mouth.    . sertraline (ZOLOFT) 50 MG tablet Take by mouth.    . simvastatin (ZOCOR) 20 MG tablet TAKE 1 TABLET NIGHTLY     No facility-administered medications prior to visit.    PAST MEDICAL HISTORY: Past Medical History:  Diagnosis Date  . Anxiety   . Dementia ([place])   . Depression   . GAD (generalized anxiety disorder)   . Gait disturbance   . GERD (gastroesophageal reflux disease)   . MCI (mild cognitive  impairment)   . Paroxysmal A-fib ([place])   . Type 2 diabetes mellitus ([place])   . Visual hallucinations     PAST SURGICAL HISTORY: Past Surgical History:  Procedure Laterality Date  . ABDOMINAL HYSTERECTOMY    . ABDOMINAL SACROCOLPOPEXY    . COLPORRHAPHY    . ESOPHAGEAL DILATION    . gastric ulcer     closure  . HIP SURGERY    . OOPHORECTOMY    . SUPRAPUBIC CATHETER INSERTION    . TONSILLECTOMY    . VAGINAL DELIVERY      FAMILY HISTORY: Family History  Problem Relation Age of Onset  . Hypertension Mother   . Thyroid disease Mother   . Hypertension Father  SOCIAL HISTORY: Social History   Socioeconomic History  . Marital status: Married    Spouse name: Not on file  . Number of children: Not on file  . Years of education: Not on file  . Highest education level: Not on file  Occupational History  . Not on file  Tobacco Use  . Smoking status: Never Smoker  . Smokeless tobacco: Never Used  Substance and Sexual Activity  . Alcohol use: Never  . Drug use: Never  . Sexual activity: Not on file  Other Topics Concern  . Not on file  Social History Narrative  . Not on file   Social Determinants of Health   Financial Resource Strain: Not on file  Food Insecurity: Not on file  Transportation Needs: Not on file  Physical Activity: Not on file  Stress: Not on file  Social Connections: Not on file  Intimate Partner Violence: Not on file      PHYSICAL EXAM  Vitals:   05/16/20 1345  BP: 128/72  Pulse: (!) 57  Weight: 104 lb (47.2 kg)  Height: 5\' 4"  (1.626 m)   Body mass index is 17.85 kg/m.  Generalized: Well developed, in no acute distress  Cardiology: irregularly irregular  Respiratory: clear to auscultation bilaterally  Neurological examination  Mentation: Alert oriented to time, place, history taking. Follows all commands speech and language fluent Cranial nerve II-XII: Pupils were equal round reactive to light. Extraocular movements were full,  visual field were full on confrontational test. Facial sensation and strength were normal. Head turning and shoulder shrug  were normal and symmetric. Motor: The motor testing reveals 5 over 5 strength of all 4 extremities. Good symmetric motor tone is noted throughout.  Gait and station: Gait is short but stable with four prong cane   DIAGNOSTIC DATA (LABS, IMAGING, TESTING) - I reviewed patient records, labs, notes, testing and imaging myself where available.  MMSE - Mini Mental State Exam 05/16/2020  Orientation to time 2  Orientation to Place 2  Registration 3  Attention/ Calculation 2  Recall 2  Language- name 2 objects 1  Language- repeat 1  Language- follow 3 step command 3  Language- read & follow direction 1  Write a sentence 1  Copy design 0  Total score 18     No results found for: WBC, HGB, HCT, MCV, PLT    Component Value Date/Time   NA 138 02/27/2007 1132   K 4.4 02/27/2007 1132   CL 104 02/27/2007 1132   CO2 28 02/27/2007 1132   GLUCOSE 143 (H) 02/27/2007 1132   BUN 15 02/27/2007 1132   CREATININE 1.3 (H) 02/27/2007 1132   CALCIUM 10.4 02/27/2007 1132   GFRNONAA 43 02/27/2007 1132   GFRAA 51 02/27/2007 1132   No results found for: CHOL, HDL, LDLCALC, LDLDIRECT, TRIG, CHOLHDL No results found for: HGBA1C No results found for: VITAMINB12 No results found for: TSH     ASSESSMENT AND PLAN 87 y.o. year old female  has a past medical history of Anxiety, Dementia ([place]), Depression, GAD (generalized anxiety disorder), Gait disturbance, GERD (gastroesophageal reflux disease), MCI (mild cognitive impairment), Paroxysmal A-fib ([place]), Type 2 diabetes mellitus ([place]), and Visual hallucinations. here with     ICD-10-CM   1. Dementia without behavioral disturbance, unspecified dementia type ([place])  F03.90   2. Visual hallucination  R44.1      Mrs Prothero is doing fairly well, today. She continues to tolerate memantine. We will continue 10mg  BID. We have discussed  starting donepezil. She has a history of afib but no structural abnormalities documented. We have discussed concerns related to potential side effects. We will discuss care plan with Dr Rexene Alberts as well as her PCP. Education information provided in AVS. Donepezil may help with hallucinations. She does not seem bothered by these events. Her husband is able to redirect her. We will continue to monitor for now. Healthy lifestyle habits encouraged. Sleep schedule discussed with son. She will continue close follow up with PCP and return to see me in 6 months. She and her son verbalize understanding and agreement with this plan.    No orders of the defined types were placed in this encounter.    No orders of the defined types were placed in this encounter.     I spent 30 minutes with the patient. 50% of this time was spent counseling and educating patient on plan of care and medications.     Debbora Presto, FNP-C 05/16/2020, 4:31 PM [facility] [address], [place], [place] [address] [phone]  I reviewed the above note and documentation by the Nurse Practitioner and agree with the history, exam, assessment and plan as outlined above. I was available for consultation. Star Age, MD, PhD [facility])

## 2020-08-02 NOTE — Patient Instructions (Incomplete)
Below is our plan:  We will ***  Please make sure you are staying well hydrated. I recommend 50-60 ounces daily. Well balanced diet and regular exercise encouraged. Consistent sleep schedule with 6-8 hours recommended.   Please continue follow up with care team as directed.   Follow up with *** in ***  You may receive a survey regarding today's visit. I encourage you to leave honest feed back as I do use this information to improve patient care. Thank you for seeing me today!      Dementia Caregiver Guide Dementia is a term used to describe a number of symptoms that affect memory and thinking. The most common symptoms include:  Memory loss.  Trouble with language and communication.  Trouble concentrating.  Poor judgment and problems with reasoning.  Wandering from home or public places.  Extreme anxiety or depression.  Being suspicious or having angry outbursts and accusations.  Child-like behavior and language. Dementia can be frightening and confusing. And taking care of someone with dementia can be challenging. This guide provides tips to help you when providing care for a person with dementia. How to help manage lifestyle changes Dementia usually gets worse slowly over time. In the early stages, people with dementia can stay independent and safe with some help. In later stages, they need help with daily tasks such as dressing, grooming, and using the bathroom. There are actions you can take to help a person manage his or her life while living with this condition. Communicating  When the person is talking or seems frustrated, make eye contact and hold the person's hand.  Ask specific questions that need yes or no answers.  Use simple words, short sentences, and a calm voice. Only give one direction at a time.  When offering choices, limit the person to just one or two.  Avoid correcting the person in a negative way.  If the person is struggling to find the right  words, gently try to help him or her. Preventing injury  Keep floors clear of clutter. Remove rugs, magazine racks, and floor lamps.  Keep hallways well lit, especially at night.  Put a handrail and nonslip mat in the bathtub or shower.  Put childproof locks on cabinets that contain dangerous items, such as medicines, alcohol, guns, toxic cleaning items, sharp tools or utensils, matches, and lighters.  For doors to the outside of the house, put the locks in places where the person cannot see or reach them easily. This will help ensure that the person does not wander out of the house and get lost.  Be prepared for emergencies. Keep a list of emergency phone numbers and addresses in a convenient area.  Remove car keys and lock garage doors so that the person does not try to get in the car and drive.  Have the person wear a bracelet that tracks locations and identifies the person as having memory problems. This should be worn at all times for safety.   Helping with daily life  Keep the person on track with his or her routine.  Try to identify areas where the person may need help.  Be supportive, patient, calm, and encouraging.  Gently remind the person that adjusting to changes takes time.  Help with the tasks that the person has asked for help with.  Keep the person involved in daily tasks and decisions as much as possible.  Encourage conversation, but try not to get frustrated if the person struggles to find words or   does not seem to appreciate your help.   How to recognize stress Look for signs of stress in yourself and in the person you are caring for. If you notice signs of stress, take steps to manage it. Symptoms of stress include:  Feeling anxious, irritable, frustrated, or angry.  Denying that the person has dementia or that his or her symptoms will not improve.  Feeling depressed, hopeless, or unappreciated.  Difficulty sleeping.  Difficulty  concentrating.  Developing stress-related health problems.  Feeling like you have too little time for your own life. Follow these instructions at home: Take care of your health Make sure that you and the person you are caring for:  Get regular sleep.  Exercise regularly.  Eat regular, nutritious meals.  Take over-the-counter and prescription medicines only as told by your health care providers.  Drink enough fluid to keep your urine pale yellow.  Attend all scheduled health care appointments.   General instructions  Join a support group with others who are caregivers.  Ask about respite care resources. Respite care can provide short-term care for the person so that you can have a regular break from the stress of caregiving.  Consider any safety risks and take steps to avoid them.  Organize medicines in a pill box for each day of the week.  Create a plan to handle any legal or financial matters. Get legal or financial advice if needed.  Keep a calendar in a central location to remind the person of appointments or other activities. Where to find support: Many individuals and organizations offer support. These include:  Support groups for people with dementia.  Support groups for caregivers.  Counselors or therapists.  Home health care services.  Adult day care centers. Where to find more information  Centers for Disease Control and Prevention: www.cdc.gov  Alzheimer's Association: www.alz.org  Family Caregiver Alliance: www.caregiver.org  Alzheimer's Foundation of America: www.alzfdn.org Contact a health care provider if:  The person's health is rapidly getting worse.  You are no longer able to care for the person.  Caring for the person is affecting your physical and emotional health.  You are feeling depressed or anxious about caring for the person. Get help right away if:  The person threatens himself or herself, you, or anyone else.  You feel  depressed or sad, or feel that you want to harm yourself. If you ever feel like your loved one may hurt himself or herself or others, or if he or she shares thoughts about taking his or her own life, get help right away. You can go to your nearest emergency department or:  Call your local emergency services (911 in the [place]  Call a suicide crisis helpline, such as the National Suicide Prevention Lifeline at 1-800-273-8255. This is open 24 hours a day in the [place]  Text the Crisis Text Line at 741741 (in the [place] Summary  Dementia is a term used to describe a number of symptoms that affect memory and thinking.  Dementia usually gets worse slowly over time.  Take steps to reduce the person's risk of injury and to plan for future care.  Caregivers need support, relief from caregiving, and time for their own lives. This information is not intended to replace advice given to you by your health care provider. Make sure you discuss any questions you have with your health care provider. Document Revised: 08/02/2019 Document Reviewed: 08/02/2019 Elsevier Patient Education  2021 [employer]vier Inc.  

## 2020-08-02 NOTE — Progress Notes (Deleted)
PATIENT: Wanda Gonzalez DOB: 1931-01-16  REASON FOR VISIT: follow up HISTORY FROM: patient  No chief complaint on file.    HISTORY OF PRESENT ILLNESS:  08/02/20 ALL:  She returns for follow up for dementia. She continues memantine 10mg  BID. We discussed adding donepezil at last visit but we decided to wait.   05/16/2020 ALL:  She returns for memory follow up. She presents, today, with her son Wanda Gonzalez) who aids in history. She continues memantine 10mg  BID. Memory is stable. She has waxing and waning hallucinations. She is not bothered by these events. She continues t live with her husband who assists in medication administration. She is able to perform ADL's independently. No falls. Appetite is good. She is off with sleep schedule. Stays up late and sleeps in late. Mood is good. She has a history of HTN and atrial fibrillation on Eliquis. No CAD documented. No stroke history. She does not remember if she has taken donepezil in the past.    01/03/2020 ALL:  Wanda Gonzalez is a 87 y.o. female here today for follow up for memory loss. She presents today with her son, Wanda Gonzalez, who aids in history. She did increase memantine to 10mg  BID. She has tolerated dose increase. Son reports that hallucinations have improved. Memory seems to be about the same. Unfortunately, she lost her daughter two weeks ago to an aortic dissection. She feels that she is holding up fairly well. She denies any significant depression. She is eating and drinking normally. No falls. She lives with her husband. She is able to perform ADL's independently. She does not drive.    HISTORY: (copied from Dr Guadelupe Sabin note on 08/31/2019)  Dear Dr. Selena Batten,   I saw your patient, Wanda Gonzalez, upon your kind request, in my neurologic clinic today for initial consultation of her memory loss.  The patient is accompanied by her daughter today.  As you know, [name] is an 87 year old right-handed woman with an underlying  medical history of hypertension, paroxysmal A. fib, reflux disease, type 2 diabetes, hyperlipidemia, anxiety, and depression, who has had memory loss for the past several years.  She has been on Namenda 5 mg twice daily for about 8 to 10 years per daughter.  She has been able to tolerate this.  In the past several years, at least 2+ years she has had intermittent visual hallucinations.  Her daughter is also worried about her ongoing depression.  Patient has been on sertraline for years.  Her visual hallucinations have been stable.  Most recently in the past few weeks they have improved in fact.  The patient has a family history of dementia, no history of Lewy body dementia in the family or Parkinson's disease.  There is also a family history of mood disorders. The patient has some difficulty providing a concise and detailed history and details are provided by her daughter.  Patient's daughter reports that patient's father had dementia and he attempted suicide at one point.  Her mother had dementia.  Her father had older children and the patient's mother was the second wife.  Patient reports that she had 6 older half siblings and one half sister had dementia.  She had a total of 3 full siblings, and 1 older sister who was her full sibling had dementia, one younger brother with dementia as well.  One of her younger full sisters did not have dementia.  Patient reports that there is anxiety and depression and also suicidal tendencies in the family.  The patient herself attempted overdose with pills when she lost her husband in 2009 and she was hospitalized in [place].  She is a lifelong non-smoker.  She has no history of excessive alcohol use.  She lives with her second husband.  He has reported to the patient's daughter that patient has a tendency to repeat herself.  She has had intermittent dizziness.  She was supposed to have a brain MRI but has not scheduled it yet.  She reports that her appetite is good, she  tries to hydrate well.  She has a history of cataract removals but has not had an eye examination in over 1 year. She has a tendency to sleep during the day.  She may go to bed in the early morning hours and may sleep till the early afternoon.   REVIEW OF SYSTEMS: Out of a complete 14 system review of symptoms, the patient complains only of the following symptoms, memory loss, depression and all other reviewed systems are negative.   ALLERGIES: Allergies  Allergen Reactions  . Amoxicillin Itching and Rash    HOME MEDICATIONS: Outpatient Medications Prior to Visit  Medication Sig Dispense Refill  . amLODipine (NORVASC) 2.5 MG tablet Take 2.5 mg by mouth daily.    . calcium carbonate (OS-CAL) 1250 (500 Ca) MG chewable tablet Chew by mouth.    . Cholecalciferol (VITAMIN D3 PO) Take by mouth.    Wanda Gonzalez Sodium (DSS) 100 MG CAPS Take by mouth.    [name] 5 MG TABS tablet Take 5 mg by mouth 2 (two) times daily.    . famotidine (PEPCID) 20 MG tablet 1 pill twice daily for stomach    . ferrous sulfate 325 (65 FE) MG tablet Take by mouth.    . memantine (NAMENDA) 10 MG tablet Take 1 tablet (10 mg total) by mouth 2 (two) times daily. 180 tablet 3  . metFORMIN (GLUCOPHAGE) 500 MG tablet TAKE ONE-HALF (1/2) TABLET DAILY WITH BREAKFAST    . metoprolol succinate (TOPROL-XL) 25 MG 24 hr tablet TAKE 1 TABLET DAILY    . Multiple Vitamin (MULTIVITAMIN) tablet Take by mouth.    . pantoprazole (PROTONIX) 40 MG tablet Take 40 mg by mouth 2 (two) times daily.    . polyethylene glycol powder (GLYCOLAX/MIRALAX) 17 GM/SCOOP powder Take by mouth.    . sertraline (ZOLOFT) 50 MG tablet Take by mouth.    . simvastatin (ZOCOR) 20 MG tablet TAKE 1 TABLET NIGHTLY     No facility-administered medications prior to visit.    PAST MEDICAL HISTORY: Past Medical History:  Diagnosis Date  . Anxiety   . Dementia ([place])   . Depression   . GAD (generalized anxiety disorder)   . Gait disturbance   . GERD  (gastroesophageal reflux disease)   . MCI (mild cognitive impairment)   . Paroxysmal A-fib ([place])   . Type 2 diabetes mellitus ([place])   . Visual hallucinations     PAST SURGICAL HISTORY: Past Surgical History:  Procedure Laterality Date  . ABDOMINAL HYSTERECTOMY    . ABDOMINAL SACROCOLPOPEXY    . COLPORRHAPHY    . ESOPHAGEAL DILATION    . gastric ulcer     closure  . HIP SURGERY    . OOPHORECTOMY    . SUPRAPUBIC CATHETER INSERTION    . TONSILLECTOMY    . VAGINAL DELIVERY      FAMILY HISTORY: Family History  Problem Relation Age of Onset  . Hypertension Mother   . Thyroid disease Mother   .  Hypertension Father     SOCIAL HISTORY: Social History   Socioeconomic History  . Marital status: Married    Spouse name: Not on file  . Number of children: Not on file  . Years of education: Not on file  . Highest education level: Not on file  Occupational History  . Not on file  Tobacco Use  . Smoking status: Never Smoker  . Smokeless tobacco: Never Used  Substance and Sexual Activity  . Alcohol use: Never  . Drug use: Never  . Sexual activity: Not on file  Other Topics Concern  . Not on file  Social History Narrative  . Not on file   Social Determinants of Health   Financial Resource Strain: Not on file  Food Insecurity: Not on file  Transportation Needs: Not on file  Physical Activity: Not on file  Stress: Not on file  Social Connections: Not on file  Intimate Partner Violence: Not on file      PHYSICAL EXAM  There were no vitals filed for this visit. There is no height or weight on file to calculate BMI.  Generalized: Well developed, in no acute distress  Cardiology: irregularly irregular  Respiratory: clear to auscultation bilaterally  Neurological examination  Mentation: Alert oriented to time, place, history taking. Follows all commands speech and language fluent Cranial nerve II-XII: Pupils were equal round reactive to light. Extraocular movements  were full, visual field were full on confrontational test. Facial sensation and strength were normal. Head turning and shoulder shrug  were normal and symmetric. Motor: The motor testing reveals 5 over 5 strength of all 4 extremities. Good symmetric motor tone is noted throughout.  Gait and station: Gait is short but stable with four prong cane   DIAGNOSTIC DATA (LABS, IMAGING, TESTING) - I reviewed patient records, labs, notes, testing and imaging myself where available.  MMSE - Mini Mental State Exam 05/16/2020  Orientation to time 2  Orientation to Place 2  Registration 3  Attention/ Calculation 2  Recall 2  Language- name 2 objects 1  Language- repeat 1  Language- follow 3 step command 3  Language- read & follow direction 1  Write a sentence 1  Copy design 0  Total score 18     No results found for: WBC, HGB, HCT, MCV, PLT    Component Value Date/Time   NA 138 02/27/2007 1132   K 4.4 02/27/2007 1132   CL 104 02/27/2007 1132   CO2 28 02/27/2007 1132   GLUCOSE 143 (H) 02/27/2007 1132   BUN 15 02/27/2007 1132   CREATININE 1.3 (H) 02/27/2007 1132   CALCIUM 10.4 02/27/2007 1132   GFRNONAA 43 02/27/2007 1132   GFRAA 51 02/27/2007 1132   No results found for: CHOL, HDL, LDLCALC, LDLDIRECT, TRIG, CHOLHDL No results found for: HGBA1C No results found for: VITAMINB12 No results found for: TSH     ASSESSMENT AND PLAN 87 y.o. year old female  has a past medical history of Anxiety, Dementia ([place]), Depression, GAD (generalized anxiety disorder), Gait disturbance, GERD (gastroesophageal reflux disease), MCI (mild cognitive impairment), Paroxysmal A-fib ([place]), Type 2 diabetes mellitus ([place]), and Visual hallucinations. here with   No diagnosis found.   Mrs Langton is doing fairly well, today. She continues to tolerate memantine. We will continue 10mg  BID. We have discussed starting donepezil. She has a history of afib but no structural abnormalities documented. We have discussed  concerns related to potential side effects. We will discuss care plan with Dr Rexene Alberts  as well as her PCP. Education information provided in AVS. Donepezil may help with hallucinations. She does not seem bothered by these events. Her husband is able to redirect her. We will continue to monitor for now. Healthy lifestyle habits encouraged. Sleep schedule discussed with son. She will continue close follow up with PCP and return to see me in 6 months. She and her son verbalize understanding and agreement with this plan.    No orders of the defined types were placed in this encounter.    No orders of the defined types were placed in this encounter.     I spent 30 minutes with the patient. 50% of this time was spent counseling and educating patient on plan of care and medications.     Debbora Presto, FNP-C 08/02/2020, 3:12 PM [facility] [address] [place], [place] [address] [phone]

## 2020-08-03 ENCOUNTER — Ambulatory Visit: Payer: Medicare Other | Admitting: Family Medicine

## 2020-08-03 ENCOUNTER — Encounter: Payer: Self-pay | Admitting: Family Medicine

## 2020-08-03 DIAGNOSIS — F0390 Unspecified dementia without behavioral disturbance: Secondary | ICD-10-CM

## 2020-09-04 ENCOUNTER — Other Ambulatory Visit: Payer: Self-pay | Admitting: Neurology

## 2020-11-14 ENCOUNTER — Ambulatory Visit: Payer: Medicare Other | Admitting: Neurology

## 2020-11-15 ENCOUNTER — Ambulatory Visit: Payer: Medicare Other | Admitting: Family Medicine

## 2020-12-30 DEATH — deceased
